# Patient Record
Sex: Female | Born: 1980 | Race: White | Hispanic: No | Marital: Married | Smoking: Never smoker
Health system: Southern US, Community
[De-identification: ages and names within clinical notes are randomized; demographics above are authoritative.]

## PROBLEM LIST (undated history)

## (undated) DIAGNOSIS — Z8489 Family history of other specified conditions: Secondary | ICD-10-CM

## (undated) DIAGNOSIS — M1990 Unspecified osteoarthritis, unspecified site: Secondary | ICD-10-CM

## (undated) DIAGNOSIS — K219 Gastro-esophageal reflux disease without esophagitis: Secondary | ICD-10-CM

## (undated) HISTORY — PX: CHOLECYSTECTOMY: SHX55

## (undated) HISTORY — PX: CARPAL TUNNEL RELEASE: SHX101

## (undated) HISTORY — PX: CERVICAL CONE BIOPSY: SUR198

---

## 2004-10-24 ENCOUNTER — Observation Stay: Payer: Self-pay

## 2004-11-30 ENCOUNTER — Observation Stay: Payer: Self-pay | Admitting: Unknown Physician Specialty

## 2004-12-01 ENCOUNTER — Observation Stay: Payer: Self-pay | Admitting: Unknown Physician Specialty

## 2004-12-02 ENCOUNTER — Inpatient Hospital Stay: Payer: Self-pay | Admitting: Obstetrics & Gynecology

## 2005-10-01 ENCOUNTER — Emergency Department: Payer: Self-pay | Admitting: Emergency Medicine

## 2007-05-04 ENCOUNTER — Ambulatory Visit: Payer: Self-pay | Admitting: Nurse Practitioner

## 2008-01-05 ENCOUNTER — Ambulatory Visit: Payer: Self-pay | Admitting: Obstetrics & Gynecology

## 2008-01-06 ENCOUNTER — Ambulatory Visit: Payer: Self-pay | Admitting: Obstetrics & Gynecology

## 2008-08-15 ENCOUNTER — Ambulatory Visit: Payer: Self-pay | Admitting: Nurse Practitioner

## 2008-09-06 ENCOUNTER — Ambulatory Visit: Payer: Self-pay | Admitting: General Surgery

## 2008-09-12 ENCOUNTER — Ambulatory Visit: Payer: Self-pay | Admitting: General Surgery

## 2009-06-06 ENCOUNTER — Ambulatory Visit: Payer: Self-pay | Admitting: Orthopedic Surgery

## 2009-06-14 ENCOUNTER — Ambulatory Visit: Payer: Self-pay | Admitting: Orthopedic Surgery

## 2010-02-03 ENCOUNTER — Emergency Department: Payer: Self-pay | Admitting: Emergency Medicine

## 2011-10-07 ENCOUNTER — Ambulatory Visit: Payer: Self-pay | Admitting: Specialist

## 2011-10-16 ENCOUNTER — Ambulatory Visit: Payer: Self-pay | Admitting: Specialist

## 2011-10-16 LAB — PREGNANCY, URINE: Pregnancy Test, Urine: NEGATIVE m[IU]/mL

## 2014-07-30 NOTE — Op Note (Signed)
PATIENT NAME:  Allison Mooney, Allison Mooney MR#:  [id number] DATE OF BIRTH:  Nov 17, 1980  DATE OF PROCEDURE:  10/16/2011  PREOPERATIVE DIAGNOSIS: Left carpal tunnel syndrome.   POSTOPERATIVE DIAGNOSIS: Left carpal tunnel syndrome.   PROCEDURE: Left carpal tunnel release.   SURGEON: Myra Rudehristopher Kyrie Fludd, M.D.   ANESTHESIA: General.   COMPLICATIONS: None.   TOURNIQUET TIME: 15 minutes.   DESCRIPTION OF PROCEDURE: After adequate induction of general anesthesia, the left upper extremity is thoroughly prepped with alcohol and ChloraPrep and draped in standard sterile fashion. The extremity is wrapped out with the Esmarch bandage and pneumatic tourniquet elevated to 250 mmHg. Under loupe magnification, a standard volar carpal tunnel incision is made and the dissection carried down to the transverse retinacular ligament. Incision is made in the midportion of the ligament with the knife. The proximal release is performed with the small scissors and the carpal tunnel scissors and the distal release is performed with the small scissors. Careful check is made both proximally and distally to ensure that complete release had been obtained. The wound is thoroughly irrigated multiple times. Skin edges are infiltrated with 1% plain Marcaine. The skin is closed with 4-0 nylon. A soft bulky dressing is applied. The patient is returned to the recovery room in satisfactory condition having tolerated the procedure quite well.  ____________________________ Clare Gandyhristopher E. Oaklee Esther, MD ces:slb D: 10/16/2011 10:37:50 ET     T: 10/16/2011 11:01:53 ET        JOB#: [id number] cc: Clare Gandyhristopher E. Sherie Dobrowolski, MD, <Dictator> Clare GandyHRISTOPHER E Sevin Langenbach MD ELECTRONICALLY SIGNED 10/17/2011 9:04

## 2017-08-04 ENCOUNTER — Encounter
Admission: RE | Admit: 2017-08-04 | Discharge: 2017-08-04 | Disposition: A | Discharge status: Home / Self Care | Payer: Managed Care, Other (non HMO) | Source: Ambulatory Visit | Attending: Specialist | Admitting: Specialist

## 2017-08-04 ENCOUNTER — Encounter: Payer: Self-pay | Admitting: *Deleted

## 2017-08-04 ENCOUNTER — Other Ambulatory Visit: Payer: Self-pay

## 2017-08-04 HISTORY — DX: Gastro-esophageal reflux disease without esophagitis: K21.9

## 2017-08-04 HISTORY — DX: Family history of other specified conditions: Z84.89

## 2017-08-04 HISTORY — DX: Unspecified osteoarthritis, unspecified site: M19.90

## 2017-08-04 NOTE — Patient Instructions (Signed)
Your procedure is scheduled on: 08-10-17 Report to Same Day Surgery 2nd floor medical mall [facility] Entrance-take elevator on left to 2nd floor.  Check in with surgery information desk.) To find out your arrival time please call [phone] between 1PM - 3PM on 08-07-17  Remember: Instructions that are not followed completely may result in serious medical risk, up to and including death, or upon the discretion of your surgeon and anesthesiologist your surgery may need to be rescheduled.    _x___ 1. Do not eat food after midnight the night before your procedure. You may drink clear liquids up to 2 hours before you are scheduled to arrive at the hospital for your procedure.  Do not drink clear liquids within 2 hours of your scheduled arrival to the hospital.  Clear liquids include  --Water or Apple juice without pulp  --Clear carbohydrate beverage such as ClearFast or Gatorade  --Black Coffee or Clear Tea (No milk, no creamers, do not add anything to the coffee or Tea   No gum chewing or hard candies.     __x__ 2. No Alcohol for 24 hours before or after surgery.   __x__3. No Smoking or e-cigarettes for 24 prior to surgery.  Do not use any chewable tobacco products for at least 6 hour prior to surgery   ____  4. Bring all medications with you on the day of surgery if instructed.    __x__ 5. Notify your doctor if there is any change in your medical condition     (cold, fever, infections).    x___6. On the morning of surgery brush your teeth with toothpaste and water.  You may rinse your mouth with mouth wash if you wish.  Do not swallow any toothpaste or mouthwash.   Do not wear jewelry, make-up, hairpins, clips or nail polish.  Do not wear lotions, powders, or perfumes. You may wear deodorant.  Do not shave 48 hours prior to surgery. Men may shave face and neck.  Do not bring valuables to the hospital.    [facility] is not responsible for any belongings or valuables.    Contacts, dentures or bridgework may not be worn into surgery.  Leave your suitcase in the car. After surgery it may be brought to your room.  For patients admitted to the hospital, discharge time is determined by your treatment team.  _  Patients discharged the day of surgery will not be allowed to drive home.  You will need someone to drive you home and stay with you the night of your procedure.   _x___ TAKE THE FOLLOWING MEDICATION THE MORNING OF SURGERY. These include:  1. NEXIUM  2. GABAPENTIN  3.  4.  5.  6.  ____Fleets enema or Magnesium Citrate as directed.   ____ Use CHG Soap or sage wipes as directed on instruction sheet   ____ Use inhalers on the day of surgery and bring to hospital day of surgery  ____ Stop Metformin and Janumet 2 days prior to surgery.    ____ Take 1/2 of usual insulin dose the night before surgery and none on the morning surgery.   ____ Follow recommendations from Cardiologist, Pulmonologist or PCP regarding stopping Aspirin, Coumadin, Plavix ,Eliquis, Effient, or Pradaxa, and Pletal.  X____Stop Anti-inflammatories such as Advil, Aleve, Ibuprofen, Motrin, Naproxen, MELOXICAM. Naprosyn, Goodies powders or aspirin products. OK to take Tylenol OR TYLENOL #3   ____ Stop supplements until after surgery.    ____ Bring C-Pap to the hospital.

## 2017-08-05 ENCOUNTER — Other Ambulatory Visit: Payer: Self-pay | Admitting: Specialist

## 2017-08-09 MED ORDER — CLINDAMYCIN PHOSPHATE 600 MG/50ML IV SOLN
600.0000 mg | INTRAVENOUS | Status: AC
  Administered 2017-08-10: 600 mg via INTRAVENOUS

## 2017-08-10 ENCOUNTER — Encounter
Admission: RE | Disposition: A | Discharge status: Home / Self Care | Payer: Self-pay | Source: Ambulatory Visit | Attending: Specialist

## 2017-08-10 ENCOUNTER — Ambulatory Visit
Admission: RE | Admit: 2017-08-10 | Discharge: 2017-08-10 | Disposition: A | Discharge status: Home / Self Care | Payer: Managed Care, Other (non HMO) | Source: Ambulatory Visit | Attending: Specialist | Admitting: Specialist

## 2017-08-10 ENCOUNTER — Ambulatory Visit: Payer: Managed Care, Other (non HMO) | Admitting: Anesthesiology

## 2017-08-10 DIAGNOSIS — Z79899 Other long term (current) drug therapy: Secondary | ICD-10-CM | POA: Insufficient documentation

## 2017-08-10 DIAGNOSIS — Y929 Unspecified place or not applicable: Secondary | ICD-10-CM | POA: Diagnosis not present

## 2017-08-10 DIAGNOSIS — K219 Gastro-esophageal reflux disease without esophagitis: Secondary | ICD-10-CM | POA: Diagnosis not present

## 2017-08-10 DIAGNOSIS — S83242A Other tear of medial meniscus, current injury, left knee, initial encounter: Secondary | ICD-10-CM | POA: Insufficient documentation

## 2017-08-10 DIAGNOSIS — X58XXXA Exposure to other specified factors, initial encounter: Secondary | ICD-10-CM | POA: Insufficient documentation

## 2017-08-10 HISTORY — PX: KNEE ARTHROSCOPY WITH MENISCAL REPAIR: SHX5653

## 2017-08-10 LAB — POCT PREGNANCY, URINE: Preg Test, Ur: NEGATIVE

## 2017-08-10 SURGERY — ARTHROSCOPY, KNEE, WITH MENISCUS REPAIR
Anesthesia: General | Site: Knee | Laterality: Left | Wound class: "Clean "

## 2017-08-10 MED ORDER — MORPHINE SULFATE (PF) 4 MG/ML IV SOLN
INTRAVENOUS | Status: DC | PRN
  Administered 2017-08-10: 4 mg via INTRAMUSCULAR

## 2017-08-10 MED ORDER — FENTANYL CITRATE (PF) 100 MCG/2ML IJ SOLN
INTRAMUSCULAR | Status: DC | PRN
  Administered 2017-08-10 (×2): 25 ug via INTRAVENOUS
  Administered 2017-08-10 (×2): 50 ug via INTRAVENOUS

## 2017-08-10 MED ORDER — HYDROCODONE-ACETAMINOPHEN 5-325 MG PO TABS
1.0000 | ORAL_TABLET | Freq: Four times a day (QID) | ORAL | Status: DC | PRN
  Administered 2017-08-10: 1 via ORAL

## 2017-08-10 MED ORDER — MIDAZOLAM HCL 2 MG/2ML IJ SOLN
INTRAMUSCULAR | Status: DC | PRN
  Administered 2017-08-10: 2 mg via INTRAVENOUS

## 2017-08-10 MED ORDER — FLUCONAZOLE 100 MG PO TABS: 100.0000 mg | ORAL_TABLET | Freq: Two times a day (BID) | ORAL | 0 refills | Status: AC

## 2017-08-10 MED ORDER — BUPIVACAINE-EPINEPHRINE (PF) 0.5% -1:200000 IJ SOLN
INTRAMUSCULAR | Status: DC | PRN
  Administered 2017-08-10: 35 mL

## 2017-08-10 MED ORDER — CHLORHEXIDINE GLUCONATE CLOTH 2 % EX PADS: 6.0000 | MEDICATED_PAD | Freq: Once | CUTANEOUS | Status: DC

## 2017-08-10 MED ORDER — MELOXICAM 7.5 MG PO TABS
15.0000 mg | ORAL_TABLET | ORAL | Status: AC
  Administered 2017-08-10: 15 mg via ORAL

## 2017-08-10 MED ORDER — PROPOFOL 10 MG/ML IV BOLUS
INTRAVENOUS | Status: AC
  Filled 2017-08-10: qty 20

## 2017-08-10 MED ORDER — ONDANSETRON HCL 4 MG/2ML IJ SOLN
INTRAMUSCULAR | Status: DC | PRN
Start: 2017-08-10 — End: 2017-08-10
  Administered 2017-08-10: 4 mg via INTRAVENOUS

## 2017-08-10 MED ORDER — GABAPENTIN 300 MG PO CAPS
300.0000 mg | ORAL_CAPSULE | ORAL | Status: AC
  Administered 2017-08-10: 300 mg via ORAL

## 2017-08-10 MED ORDER — HYDROCODONE-ACETAMINOPHEN 5-325 MG PO TABS
ORAL_TABLET | ORAL | Status: AC
  Filled 2017-08-10: qty 1

## 2017-08-10 MED ORDER — ACETAMINOPHEN 10 MG/ML IV SOLN
INTRAVENOUS | Status: AC
  Filled 2017-08-10: qty 100

## 2017-08-10 MED ORDER — HYDROCODONE-ACETAMINOPHEN 7.5-325 MG PO TABS
1.0000 | ORAL_TABLET | Freq: Once | ORAL | Status: DC | PRN
  Filled 2017-08-10: qty 1

## 2017-08-10 MED ORDER — ACETAMINOPHEN 500 MG PO TABS: 1000.0000 mg | ORAL_TABLET | Freq: Once | ORAL | Status: DC

## 2017-08-10 MED ORDER — PROMETHAZINE HCL 25 MG/ML IJ SOLN: 6.2500 mg | INTRAMUSCULAR | Status: DC | PRN

## 2017-08-10 MED ORDER — MIDAZOLAM HCL 2 MG/2ML IJ SOLN
INTRAMUSCULAR | Status: AC
  Filled 2017-08-10: qty 2

## 2017-08-10 MED ORDER — CEFOXITIN SODIUM-DEXTROSE 2-2.2 GM-%(50ML) IV SOLR
INTRAVENOUS | Status: AC
  Filled 2017-08-10: qty 50

## 2017-08-10 MED ORDER — BUPIVACAINE-EPINEPHRINE (PF) 0.25% -1:200000 IJ SOLN
INTRAMUSCULAR | Status: AC
Start: 2017-08-10 — End: 2017-08-10
  Filled 2017-08-10: qty 30

## 2017-08-10 MED ORDER — LACTATED RINGERS IV SOLN
INTRAVENOUS | Status: DC
  Administered 2017-08-10: 11:00:00 via INTRAVENOUS

## 2017-08-10 MED ORDER — CEFAZOLIN SODIUM-DEXTROSE 2-4 GM/100ML-% IV SOLN
2.0000 g | INTRAVENOUS | Status: AC
  Administered 2017-08-10: 2 g via INTRAVENOUS

## 2017-08-10 MED ORDER — DEXAMETHASONE SODIUM PHOSPHATE 10 MG/ML IJ SOLN
INTRAMUSCULAR | Status: DC | PRN
  Administered 2017-08-10: 10 mg via INTRAVENOUS

## 2017-08-10 MED ORDER — BUPIVACAINE-EPINEPHRINE (PF) 0.25% -1:200000 IJ SOLN
INTRAMUSCULAR | Status: DC | PRN
  Administered 2017-08-10: 30 mL

## 2017-08-10 MED ORDER — FENTANYL CITRATE (PF) 100 MCG/2ML IJ SOLN
INTRAMUSCULAR | Status: AC
  Filled 2017-08-10: qty 2

## 2017-08-10 MED ORDER — GABAPENTIN 300 MG PO CAPS
ORAL_CAPSULE | ORAL | Status: AC
  Filled 2017-08-10: qty 1

## 2017-08-10 MED ORDER — PROPOFOL 10 MG/ML IV BOLUS
INTRAVENOUS | Status: DC | PRN
  Administered 2017-08-10: 160 mg via INTRAVENOUS

## 2017-08-10 MED ORDER — MEPERIDINE HCL 50 MG/ML IJ SOLN: 6.2500 mg | INTRAMUSCULAR | Status: DC | PRN

## 2017-08-10 MED ORDER — BUPIVACAINE-EPINEPHRINE (PF) 0.25% -1:200000 IJ SOLN
INTRAMUSCULAR | Status: AC
  Filled 2017-08-10: qty 30

## 2017-08-10 MED ORDER — MORPHINE SULFATE (PF) 4 MG/ML IV SOLN
INTRAVENOUS | Status: AC
  Filled 2017-08-10: qty 1

## 2017-08-10 MED ORDER — CLINDAMYCIN PHOSPHATE 600 MG/50ML IV SOLN
INTRAVENOUS | Status: AC
  Filled 2017-08-10: qty 50

## 2017-08-10 MED ORDER — LIDOCAINE HCL (CARDIAC) PF 100 MG/5ML IV SOSY
PREFILLED_SYRINGE | INTRAVENOUS | Status: DC | PRN
Start: 2017-08-10 — End: 2017-08-10
  Administered 2017-08-10: 100 mg via INTRAVENOUS

## 2017-08-10 MED ORDER — HYDROMORPHONE HCL 1 MG/ML IJ SOLN: 0.2500 mg | INTRAMUSCULAR | Status: DC | PRN

## 2017-08-10 MED ORDER — MELOXICAM 7.5 MG PO TABS
ORAL_TABLET | ORAL | Status: AC
  Filled 2017-08-10: qty 2

## 2017-08-10 MED ORDER — HYDROCODONE-ACETAMINOPHEN 5-325 MG PO TABS: 1.0000 | ORAL_TABLET | Freq: Four times a day (QID) | ORAL | 0 refills | Status: DC | PRN

## 2017-08-10 MED ORDER — SEVOFLURANE IN SOLN
RESPIRATORY_TRACT | Status: AC
  Filled 2017-08-10: qty 250

## 2017-08-10 MED ORDER — ACETAMINOPHEN 10 MG/ML IV SOLN
INTRAVENOUS | Status: DC | PRN
  Administered 2017-08-10: 1000 mg via INTRAVENOUS

## 2017-08-10 MED ORDER — LACTATED RINGERS IR SOLN
Status: DC | PRN
  Administered 2017-08-10: 1

## 2017-08-10 MED ORDER — BUPIVACAINE-EPINEPHRINE (PF) 0.5% -1:200000 IJ SOLN
INTRAMUSCULAR | Status: AC
Start: 2017-08-10 — End: 2017-08-10
  Filled 2017-08-10: qty 60

## 2017-08-10 SURGICAL SUPPLY — 28 items
ADAPTER IRRIG TUBE 2 SPIKE SOL (ADAPTER) ×6 IMPLANT
BLADE AGGRESSIVE PLUS 4.0 (BLADE) IMPLANT
BUR RADIUS 4.0X18.5 (BURR) ×3 IMPLANT
CHLORAPREP W/TINT 26ML (MISCELLANEOUS) ×3 IMPLANT
CUFF TOURN 24 STER (MISCELLANEOUS) IMPLANT
CUFF TOURN 30 STER DUAL PORT (MISCELLANEOUS) IMPLANT
CUTTER SLOTTED WHISKER 4.0 (BURR) IMPLANT
GAUZE SPONGE 4X4 12PLY STRL (GAUZE/BANDAGES/DRESSINGS) ×3 IMPLANT
GAUZE SPONGE 4X4 16PLY XRAY LF (GAUZE/BANDAGES/DRESSINGS) ×2 IMPLANT
GAUZE XEROFORM 4X4 STRL (GAUZE/BANDAGES/DRESSINGS) ×2 IMPLANT
GLOVE BIO SURGEON STRL SZ8 (GLOVE) ×3 IMPLANT
GOWN STRL REUS W/ TWL LRG LVL3 (GOWN DISPOSABLE) ×2 IMPLANT
GOWN STRL REUS W/TWL LRG LVL3 (GOWN DISPOSABLE) ×4
GOWN STRL REUS W/TWL LRG LVL4 (GOWN DISPOSABLE) ×3 IMPLANT
IV LACTATED RINGER IRRG 3000ML (IV SOLUTION) ×12
IV LR IRRIG 3000ML ARTHROMATIC (IV SOLUTION) ×6 IMPLANT
KIT TURNOVER KIT A (KITS) ×3 IMPLANT
MANIFOLD NEPTUNE II (INSTRUMENTS) ×3 IMPLANT
NDL SAFETY ECLIPSE 18X1.5 (NEEDLE) ×2 IMPLANT
NEEDLE HYPO 18GX1.5 SHARP (NEEDLE) ×4
PACK ARTHROSCOPY KNEE (MISCELLANEOUS) ×3 IMPLANT
SET TUBE SUCT SHAVER OUTFL 24K (TUBING) ×3 IMPLANT
SOL PREP PVP 2OZ (MISCELLANEOUS) ×3
SOLUTION PREP PVP 2OZ (MISCELLANEOUS) ×1 IMPLANT
SUT ETHILON 3 0 FSLX (SUTURE) ×3 IMPLANT
SYR 30ML LL (SYRINGE) ×6 IMPLANT
TUBING ARTHRO INFLOW-ONLY STRL (TUBING) ×3 IMPLANT
WAND COBLATION FLOW 50 (SURGICAL WAND) ×3 IMPLANT

## 2017-08-10 NOTE — Anesthesia Preprocedure Evaluation (Addendum)
Anesthesia Evaluation  Patient identified by MRN, date of birth, ID band Patient awake    Reviewed: Allergy & Precautions, H&P , NPO status , reviewed documented beta blocker date and time   Airway Mallampati: III  TM Distance: >3 FB Neck ROM: Full    Dental   Pulmonary neg pulmonary ROS,    Pulmonary exam normal        Cardiovascular negative cardio ROS Normal cardiovascular exam     Neuro/Psych negative neurological ROS  negative psych ROS   GI/Hepatic negative GI ROS, Neg liver ROS, GERD  Medicated and Controlled,  Endo/Other  negative endocrine ROSMorbid obesity  Renal/GU      Musculoskeletal   Abdominal (+) + obese,   Peds  Hematology negative hematology ROS (+)   Anesthesia Other Findings Past Medical History: No date: Arthritis No date: Family history of adverse reaction to anesthesia     Comment:  MOM-DISORIENTED No date: GERD (gastroesophageal reflux disease)  Past Surgical History: No date: CARPAL TUNNEL RELEASE; Bilateral No date: CERVICAL CONE BIOPSY No date: CHOLECYSTECTOMY     Reproductive/Obstetrics                            Anesthesia Physical Anesthesia Plan  ASA: II  Anesthesia Plan: General   Post-op Pain Management:    Induction:   PONV Risk Score and Plan: 3 and Ondansetron, Midazolam and Metaclopromide  Airway Management Planned:   Additional Equipment:   Intra-op Plan:   Post-operative Plan:   Informed Consent: I have reviewed the patients History and Physical, chart, labs and discussed the procedure including the risks, benefits and alternatives for the proposed anesthesia with the patient or authorized representative who has indicated his/her understanding and acceptance.   Dental Advisory Given  Plan Discussed with: CRNA  Anesthesia Plan Comments:         Anesthesia Quick Evaluation

## 2017-08-10 NOTE — Op Note (Signed)
08/10/2017  3:24 PM  PATIENT:  Allison Mooney    PRE-OPERATIVE DIAGNOSIS:  Z61.096E Oth tear of medial meniscus, current injury, left knee, init  POST-OPERATIVE DIAGNOSIS:  Same  PROCEDURE:  KNEE ARTHROSCOPY WITH  MEDIAL AND LATERAL MENISCECTOMIES AND LOOSE BODY REMOVAL  SURGEON:  Jason Frisbee E, MD  COMPLICATIONS:   None  EBL: None  TOURNIQUET TIME:   None  ANESTHESIA: General LMA  PREOPERATIVE INDICATIONS:  Allison Mooney is a  37 y.o. female with a diagnosis of S83.242A Oth tear of medial meniscus, current injury, left knee, init who failed conservative measures and elected for surgical management.    The risks benefits and alternatives were discussed with the patient preoperatively including but not limited to the risks of infection, bleeding, nerve injury, cardiopulmonary complications, the need for revision surgery, among others, and the patient was willing to proceed.  OPERATIVE IMPLANTS: None  OPERATIVE FINDINGS: Large full-thickness loss of articular cartilage lateral tibial plateau centrally, grade 3 damage to medial femoral condyle anteromedially, loose bodies, fraying of patella.  OPERATIVE PROCEDURE: The patient was brought to the operating room and underwent satisfactory general LMA anesthesia in the supine position.  The leg was prepped and draped in a sterile fashion.  Arthroscopy was carried out through standard portals.  The above findings were encountered upon arthroscopy.  The anterior soft tissues were debrided for visualization.  Loose bodies were suctioned out and removed with a motorized shaver.  The medial compartment was examined.  The medial meniscus did not appear to have any significant tearing other than anteromedially which was debrided.  Posteriorly it was intact.  The lateral compartment was examined.  There is a large 2 x 2 centimeter defect full-thickness in the lateral tibial plateau in the central portion.  This was cauterized.  There was fraying of  the lateral meniscus laterally and anteriorly which was debrided with basket forceps and motorized shaver.  The patella showed grade 3 chondromalacia which was cauterized.  The medial and lateral gutters were examined repeatedly with no further loose bodies noted.  The suprapatellar pouch was examined and was suctioned free of debris.  The anterior cruciate ligament and intercondylar notch were normal.  The lateral femoral condyle was intact.  After thorough irrigation and multiple examinations for any more loose bodies the instruments were removed.   Once this was completed and stabilized the joint was thoroughly irrigated.  The knee wounds were closed with 3-0 nylon.  Sponge and needle counts were correct. A dry sterile dressing was applied.  Patient was awakened and taken to recovery in good condition.   Valinda Hoar, MD

## 2017-08-10 NOTE — Anesthesia Postprocedure Evaluation (Signed)
Anesthesia Post Note  Patient: SHYLYNN BRUNING  Procedure(s) Performed: KNEE ARTHROSCOPY WITH MENISCAL REPAIR (Left Knee)  Patient location during evaluation: PACU Anesthesia Type: General Level of consciousness: awake and alert and oriented Pain management: pain level controlled Vital Signs Assessment: post-procedure vital signs reviewed and stable Respiratory status: spontaneous breathing, nonlabored ventilation and respiratory function stable Cardiovascular status: blood pressure returned to baseline and stable Postop Assessment: no signs of nausea or vomiting Anesthetic complications: no     Last Vitals:  Vitals:   08/10/17 1602 08/10/17 1614  BP: 105/68 115/62  Pulse: 84 83  Resp:  14  Temp:  36.7 C  SpO2: 95% 100%    Last Pain:  Vitals:   08/10/17 1642  TempSrc:   PainSc: 5                  Cordarious Zeek

## 2017-08-10 NOTE — H&P (Signed)
THE PATIENT WAS SEEN PRIOR TO SURGERY TODAY.  HISTORY, ALLERGIES, HOME MEDICATIONS AND OPERATIVE PROCEDURE WERE REVIEWED. RISKS AND BENEFITS OF SURGERY DISCUSSED WITH PATIENT AGAIN.  NO CHANGES FROM INITIAL HISTORY AND PHYSICAL NOTED.    

## 2017-08-10 NOTE — Anesthesia Procedure Notes (Signed)
Procedure Name: LMA Insertion Date/Time: 08/10/2017 1:31 PM Performed by: Bari Mantis I, CRNA Pre-anesthesia Checklist: Patient identified, Patient being monitored, Timeout performed, Emergency Drugs available and Suction available Patient Re-evaluated:Patient Re-evaluated prior to induction Oxygen Delivery Method: Circle system utilized Preoxygenation: Pre-oxygenation with 100% oxygen Induction Type: IV induction Ventilation: Mask ventilation without difficulty LMA: LMA inserted LMA Size: 4.0 Tube type: Oral Number of attempts: 1 Placement Confirmation: positive ETCO2 and breath sounds checked- equal and bilateral Tube secured with: Tape Dental Injury: Teeth and Oropharynx as per pre-operative assessment

## 2017-08-10 NOTE — Anesthesia Post-op Follow-up Note (Signed)
Anesthesia QCDR form completed.        

## 2017-08-10 NOTE — Discharge Instructions (Signed)

## 2017-08-10 NOTE — Transfer of Care (Signed)
Immediate Anesthesia Transfer of Care Note  Patient: Allison Mooney  Procedure(s) Performed: KNEE ARTHROSCOPY WITH MENISCAL REPAIR (Left Knee)  Patient Location: PACU  Anesthesia Type:General  Level of Consciousness: sedated  Airway & Oxygen Therapy: Patient Spontanous Breathing and Patient connected to face mask oxygen  Post-op Assessment: Report given to RN and Post -op Vital signs reviewed and stable  Post vital signs: Reviewed and stable  Last Vitals:  Vitals Value Taken Time  BP 117/67 08/10/2017  3:32 PM  Temp 36.5 C 08/10/2017  3:32 PM  Pulse 92 08/10/2017  3:33 PM  Resp 12 08/10/2017  3:32 PM  SpO2 100 % 08/10/2017  3:33 PM  Vitals shown include unvalidated device data.  Last Pain:  Vitals:   08/10/17 1532  TempSrc: Temporal  PainSc:          Complications: No apparent anesthesia complications

## 2017-08-11 ENCOUNTER — Encounter: Payer: Self-pay | Admitting: Specialist

## 2017-11-10 ENCOUNTER — Encounter: Payer: Self-pay | Admitting: Obstetrics and Gynecology

## 2017-11-17 ENCOUNTER — Encounter: Payer: Self-pay | Admitting: Obstetrics and Gynecology

## 2017-11-24 ENCOUNTER — Encounter: Payer: Self-pay | Admitting: Obstetrics and Gynecology

## 2017-11-24 ENCOUNTER — Ambulatory Visit (INDEPENDENT_AMBULATORY_CARE_PROVIDER_SITE_OTHER): Payer: Managed Care, Other (non HMO) | Admitting: Obstetrics and Gynecology

## 2017-11-24 ENCOUNTER — Other Ambulatory Visit (HOSPITAL_COMMUNITY)
Admission: RE | Admit: 2017-11-24 | Discharge: 2017-11-24 | Disposition: A | Discharge status: Home / Self Care | Payer: Managed Care, Other (non HMO) | Source: Ambulatory Visit | Attending: Obstetrics and Gynecology | Admitting: Obstetrics and Gynecology

## 2017-11-24 VITALS — BP 132/90 | HR 81 | Ht 68.0 in | Wt 266.0 lb

## 2017-11-24 DIAGNOSIS — Z124 Encounter for screening for malignant neoplasm of cervix: Secondary | ICD-10-CM | POA: Insufficient documentation

## 2017-11-24 DIAGNOSIS — R87810 Cervical high risk human papillomavirus (HPV) DNA test positive: Secondary | ICD-10-CM | POA: Insufficient documentation

## 2017-11-24 DIAGNOSIS — Z113 Encounter for screening for infections with a predominantly sexual mode of transmission: Secondary | ICD-10-CM | POA: Diagnosis present

## 2017-11-24 DIAGNOSIS — N761 Subacute and chronic vaginitis: Secondary | ICD-10-CM | POA: Diagnosis not present

## 2017-11-24 DIAGNOSIS — E282 Polycystic ovarian syndrome: Secondary | ICD-10-CM | POA: Diagnosis not present

## 2017-11-24 DIAGNOSIS — Z6841 Body Mass Index (BMI) 40.0 and over, adult: Secondary | ICD-10-CM

## 2017-11-24 MED ORDER — METRONIDAZOLE 500 MG PO TABS: 500.0000 mg | ORAL_TABLET | Freq: Two times a day (BID) | ORAL | 0 refills | Status: DC

## 2017-11-24 MED ORDER — TERCONAZOLE 0.4 % VA CREA: 1.0000 | TOPICAL_CREAM | Freq: Every day | VAGINAL | 1 refills | Status: DC

## 2017-11-24 NOTE — Progress Notes (Signed)
Obstetrics & Gynecology Office Visit   Chief Complaint:  Chief Complaint  Patient presents with  . Vaginal irritation    discharge,  vaginal itching    History of Present Illness: [name] is a 37 y.o. No obstetric history on file. who LMP was No LMP recorded. (Menstrual status: Other)., presents today for a problem visit.   Patient complains of an abnormal vaginal discharge.  Discharge described as: white and thick. Vaginal symptoms include local irritation.   Other associated symptoms: local irritation.Menstrual pattern: She had achieved amenorrhea of depo provera.  Contraception: Depo-Provera injections.  She denies recent antibiotic exposure, denies changes in soaps, detergents coinciding with the onset of her symptoms.  She has previously self treated or been under treatment by another provider for these symptoms.  She relays a 2 year history of recurrent vaginitis with several episodes of BV and candida each year.  Frequency appears to have increased per patient after Mirena removal.    She does have a prior history of PCOS.  Weight is up 30lbs in last 5 years.  Review of Systems: Review of Systems  Constitutional: Negative.   Gastrointestinal: Negative.   Genitourinary: Positive for dysuria. Negative for flank pain, frequency, hematuria and urgency.  Skin: Negative.    Past Medical History:  Past Medical History:  Diagnosis Date  . Arthritis   . Family history of adverse reaction to anesthesia    MOM-DISORIENTED  . GERD (gastroesophageal reflux disease)     Past Surgical History:  Past Surgical History:  Procedure Laterality Date  . CARPAL TUNNEL RELEASE Bilateral   . CERVICAL CONE BIOPSY    . CHOLECYSTECTOMY    . KNEE ARTHROSCOPY WITH MENISCAL REPAIR Left 08/10/2017   Procedure: KNEE ARTHROSCOPY WITH MENISCAL REPAIR;  Surgeon: Deeann Saint, MD;  Location: [facility] ORS;  Service: Orthopedics;  Laterality: Left;    Gynecologic History: No LMP recorded.  (Menstrual status: Other).  Obstetric History: No obstetric history on file.  Family History:  History reviewed. No pertinent family history.  Social History:  Social History   Socioeconomic History  . Marital status: Married    Spouse name: Not on file  . Number of children: Not on file  . Years of education: Not on file  . Highest education level: Not on file  Occupational History  . Not on file  Social Needs  . Financial resource strain: Not on file  . Food insecurity:    Worry: Not on file    Inability: Not on file  . Transportation needs:    Medical: Not on file    Non-medical: Not on file  Tobacco Use  . Smoking status: Never Smoker  . Smokeless tobacco: Never Used  Substance and Sexual Activity  . Alcohol use: Never    Frequency: Never  . Drug use: Never  . Sexual activity: Yes    Birth control/protection: Injection  Lifestyle  . Physical activity:    Days per week: Not on file    Minutes per session: Not on file  . Stress: Not on file  Relationships  . Social connections:    Talks on phone: Not on file    Gets together: Not on file    Attends religious service: Not on file    Active member of club or organization: Not on file    Attends meetings of clubs or organizations: Not on file    Relationship status: Not on file  . Intimate partner violence:  Fear of current or ex partner: Not on file    Emotionally abused: Not on file    Physically abused: Not on file    Forced sexual activity: Not on file  Other Topics Concern  . Not on file  Social History Narrative  . Not on file    Allergies:  Allergies  Allergen Reactions  . Diflucan [Fluconazole] Itching and Rash  . Penicillins Itching and Rash    Has patient had a PCN reaction causing immediate rash, facial/tongue/throat swelling, SOB or lightheadedness with hypotension: Yes Has patient had a PCN reaction causing severe rash involving mucus membranes or skin necrosis: No Has patient had a PCN  reaction that required hospitalization: Yes Has patient had a PCN reaction occurring within the last 10 years: Yes If all of the above answers are "NO", then may proceed with Cephalosporin use.   . Sulfa Antibiotics Itching and Rash    Medications: Prior to Admission medications   Medication Sig Start Date End Date Taking? Authorizing Provider  acetaminophen-codeine (TYLENOL #3) 300-30 MG tablet Take 1 tablet by mouth daily as needed for moderate pain.    [provider]  esomeprazole (NEXIUM) 20 MG capsule Take 20 mg by mouth daily at 12 noon.    [provider]  gabapentin (NEURONTIN) 300 MG capsule Take 300-600 mg by mouth See admin instructions. Take 300 mg at lunch and 600 mg at bedtime    [provider]  HYDROcodone-acetaminophen (NORCO) 5-325 MG tablet Take 1 tablet by mouth every 6 (six) hours as needed. 08/10/17   Deeann SaintMiller, Howard, MD  medroxyPROGESTERone (DEPO-PROVERA) 150 MG/ML injection Inject 150 mg into the muscle every 3 (three) months.    [provider]  meloxicam (MOBIC) 15 MG tablet Take 15 mg by mouth daily at 12 noon.    [provider]  Probiotic CAPS Take 1 capsule by mouth daily at 12 noon.    [provider]  terconazole (TERAZOL 3) 80 MG vaginal suppository Place 80 mg vaginally daily as needed (bacterial vaginosis).    [provider]    Physical Exam Vitals: Blood pressure 132/90, pulse 81, height 5\' 8"  (1.727 m), weight 266 lb (120.7 kg).  Body mass index is 40.45 kg/m.  No LMP recorded. (Menstrual status: Other).  General: NAD HEENT: normocephalic, anicteric Pulmonary: No increased work of breathing Genitourinary:  External: Normal external female genitalia.  Normal urethral meatus, normal  Bartholin's and Skene's glands.    Vagina: Normal vaginal mucosa, no evidence of prolapse.    Cervix: Grossly normal in appearance, no bleeding  Uterus: Non-enlarged, mobile, normal contour.  No  CMT  Adnexa: ovaries non-enlarged, no adnexal masses  Rectal: deferred  Lymphatic: no evidence of inguinal lymphadenopathy Extremities: no edema, erythema, or tenderness Neurologic: Grossly intact Psychiatric: mood appropriate, affect full  Female chaperone present for pelvic  portions of the physical exam  Wet Prep: Clue Cells: Positive Fungal elements: Positive Trichomonas: Negative   Assessment: 37 y.o. with chronic BV and vaginal candida  Plan: Problem List Items Addressed This Visit    None    Visit Diagnoses    Screening for malignant neoplasm of cervix    -  Primary   Relevant Orders   Cytology - PAP   Chronic vaginitis       Relevant Orders   Hemoglobin A1c (Completed)   NuSwab BV and Candida, NAA   Class 3 severe obesity without serious comorbidity with body mass index (BMI) of 40.0 to 44.9  in adult, unspecified obesity type (HCC)       Relevant Orders   Hemoglobin A1c (Completed)   PCOS (polycystic ovarian syndrome)       Relevant Orders   Hemoglobin A1c (Completed)   Routine screening for STI (sexually transmitted infection)       Relevant Orders   Cytology - PAP      1) Risk factors for bacterial vaginosis and candida infections discussed.  We discussed normal vaginal flora/microbiome.  Any factors that may alter the microbiome increase the risk of these opportunistic infections.  These include changes in pH, antibiotic exposures, diabetes, wet bathing suits etc.  We discussed that treatment is aimed at eradicating abnormal bacterial overgrowth and or yeast.  There may be some role for vaginal probiotics in restoring normal vaginal flora.   - pap out of date collected today - NuSwab BV candida - Rx flagyl and terazole cream - HgbA1C given obesity, PCOS and recurrent/chronic nature   Vena AustriaAndreas Cortny Bambach, MD, Merlinda FrederickFACOG [facility], [facility] 11/24/2017, 11:00 AM

## 2017-11-25 LAB — HEMOGLOBIN A1C
Est. average glucose Bld gHb Est-mCnc: 103 mg/dL
Hgb A1c MFr Bld: 5.2 % (ref 4.8–5.6)

## 2017-11-26 LAB — NUSWAB BV AND CANDIDA, NAA
Candida albicans, NAA: NEGATIVE
Candida glabrata, NAA: NEGATIVE

## 2017-11-27 LAB — CYTOLOGY - PAP
Chlamydia: NEGATIVE
Diagnosis: NEGATIVE
HPV (WINDOPATH): DETECTED — AB
HPV 16/18/45 genotyping: POSITIVE — AB
Neisseria Gonorrhea: NEGATIVE
TRICH (WINDOWPATH): NEGATIVE

## 2017-12-02 ENCOUNTER — Telehealth: Payer: Self-pay | Admitting: Obstetrics and Gynecology

## 2017-12-02 NOTE — Telephone Encounter (Signed)
Patient is calling for labs results. Please advise. 

## 2017-12-08 ENCOUNTER — Ambulatory Visit (INDEPENDENT_AMBULATORY_CARE_PROVIDER_SITE_OTHER): Payer: Managed Care, Other (non HMO)

## 2017-12-08 DIAGNOSIS — N912 Amenorrhea, unspecified: Secondary | ICD-10-CM

## 2017-12-08 DIAGNOSIS — Z3202 Encounter for pregnancy test, result negative: Secondary | ICD-10-CM

## 2017-12-08 DIAGNOSIS — Z3042 Encounter for surveillance of injectable contraceptive: Secondary | ICD-10-CM | POA: Diagnosis not present

## 2017-12-08 LAB — POCT URINE PREGNANCY: PREG TEST UR: NEGATIVE

## 2017-12-08 MED ORDER — MEDROXYPROGESTERONE ACETATE 150 MG/ML IM SUSP
150.0000 mg | Freq: Once | INTRAMUSCULAR | Status: AC
  Administered 2017-12-08: 150 mg via INTRAMUSCULAR

## 2017-12-08 NOTE — Progress Notes (Signed)
Pt presents for Depo Provera. She has previously received in [place], [place] and states her last dose was received on 09/15/17. No record of this on file. Pregnancy test performed per protocol. (negative)

## 2017-12-23 ENCOUNTER — Ambulatory Visit: Payer: Managed Care, Other (non HMO) | Admitting: Obstetrics and Gynecology

## 2017-12-31 ENCOUNTER — Ambulatory Visit (INDEPENDENT_AMBULATORY_CARE_PROVIDER_SITE_OTHER): Payer: Managed Care, Other (non HMO) | Admitting: Obstetrics and Gynecology

## 2017-12-31 ENCOUNTER — Encounter: Payer: Self-pay | Admitting: Obstetrics and Gynecology

## 2017-12-31 ENCOUNTER — Other Ambulatory Visit (HOSPITAL_COMMUNITY)
Admission: RE | Admit: 2017-12-31 | Discharge: 2017-12-31 | Disposition: A | Discharge status: Home / Self Care | Payer: Managed Care, Other (non HMO) | Source: Ambulatory Visit | Attending: Obstetrics and Gynecology | Admitting: Obstetrics and Gynecology

## 2017-12-31 VITALS — BP 130/76 | HR 98 | Ht 68.0 in | Wt 266.0 lb

## 2017-12-31 DIAGNOSIS — N870 Mild cervical dysplasia: Secondary | ICD-10-CM | POA: Insufficient documentation

## 2017-12-31 DIAGNOSIS — R87810 Cervical high risk human papillomavirus (HPV) DNA test positive: Secondary | ICD-10-CM

## 2017-12-31 NOTE — Progress Notes (Signed)
Obstetrics & Gynecology Office Visit   Chief Complaint:  Chief Complaint  Patient presents with  . Colposcopy    History of Present Illness:Allison Mooney is a 37 y.o. woman who presents today for continued surveillance for history of dysplasia. Last pap obtained on 11/24/17 revealed NIL HR HPV positive subtype 16 positive.  Prior paps were normal but remote history of CKC at age 48. No abnormal bleeding  Pap/Treatment History:  01/09/2010 NIL 11/23/2008 NIL 11/22/2007 NIL 07/14/2006 NIL 06/25/2005 NIL 06/17/2004 NIL Reports prior conization of cervix at age 37  Review of Systems: Review of systems negative unless noted on HPI  Past Medical History:  Past Medical History:  Diagnosis Date  . Arthritis   . Family history of adverse reaction to anesthesia    MOM-DISORIENTED  . GERD (gastroesophageal reflux disease)     Past Surgical History:  Past Surgical History:  Procedure Laterality Date  . CARPAL TUNNEL RELEASE Bilateral   . CERVICAL CONE BIOPSY    . CHOLECYSTECTOMY    . KNEE ARTHROSCOPY WITH MENISCAL REPAIR Left 08/10/2017   Procedure: KNEE ARTHROSCOPY WITH MENISCAL REPAIR;  Surgeon: Deeann Saint, MD;  Location: [facility] ORS;  Service: Orthopedics;  Laterality: Left;    Gynecologic History: No LMP recorded. (Menstrual status: Other).  Obstetric History: G1P1001  Family History:  No family history on file.  Social History:  Social History   Socioeconomic History  . Marital status: Married    Spouse name: Not on file  . Number of children: Not on file  . Years of education: Not on file  . Highest education level: Not on file  Occupational History  . Not on file  Social Needs  . Financial resource strain: Not on file  . Food insecurity:    Worry: Not on file    Inability: Not on file  . Transportation needs:    Medical: Not on file    Non-medical: Not on file  Tobacco Use  . Smoking status: Never Smoker  . Smokeless tobacco: Never Used  Substance  and Sexual Activity  . Alcohol use: Never    Frequency: Never  . Drug use: Never  . Sexual activity: Yes    Birth control/protection: Injection  Lifestyle  . Physical activity:    Days per week: Not on file    Minutes per session: Not on file  . Stress: Not on file  Relationships  . Social connections:    Talks on phone: Not on file    Gets together: Not on file    Attends religious service: Not on file    Active member of club or organization: Not on file    Attends meetings of clubs or organizations: Not on file    Relationship status: Not on file  . Intimate partner violence:    Fear of current or ex partner: Not on file    Emotionally abused: Not on file    Physically abused: Not on file    Forced sexual activity: Not on file  Other Topics Concern  . Not on file  Social History Narrative  . Not on file    Allergies:  Allergies  Allergen Reactions  . Diflucan [Fluconazole] Itching and Rash  . Penicillins Itching and Rash    Has patient had a PCN reaction causing immediate rash, facial/tongue/throat swelling, SOB or lightheadedness with hypotension: Yes Has patient had a PCN reaction causing severe rash involving mucus membranes or skin necrosis: No Has patient had  a PCN reaction that required hospitalization: Yes Has patient had a PCN reaction occurring within the last 10 years: Yes If all of the above answers are "NO", then may proceed with Cephalosporin use.   . Sulfa Antibiotics Itching and Rash    Medications: Prior to Admission medications   Medication Sig Start Date End Date Taking? Authorizing Provider  acetaminophen-codeine (TYLENOL #3) 300-30 MG tablet Take 1 tablet by mouth daily as needed for moderate pain.   Yes [provider]  esomeprazole (NEXIUM) 20 MG capsule Take 20 mg by mouth daily at 12 noon.   Yes [provider]  gabapentin (NEURONTIN) 300 MG capsule Take 300-600 mg by mouth See admin instructions. Take 300 mg at lunch and  600 mg at bedtime   Yes [provider]  medroxyPROGESTERone (DEPO-PROVERA) 150 MG/ML injection Inject 150 mg into the muscle every 3 (three) months.   Yes [provider]  meloxicam (MOBIC) 15 MG tablet Take 15 mg by mouth daily at 12 noon.   Yes [provider]  Probiotic CAPS Take 1 capsule by mouth daily at 12 noon.   Yes [provider]  traMADol Janean Sark) 50 MG tablet  12/25/17  Yes [provider]    Physical Exam Vitals:  Vitals:   12/31/17 1427  BP: 130/76  Pulse: 98   No LMP recorded. (Menstrual status: Other).  General: NAD HEENT: normocephalic, anicteric Pulmonary: No increased work of breathing Genitourinary:  External: Normal external female genitalia.  Normal urethral meatus, normal  Bartholin's and Skene's glands.    Vagina: Normal vaginal mucosa, no evidence of prolapse.    Cervix: Grossly normal in appearance, no bleeding  Uterus: Non-enlarged, mobile, normal contour.  No CMT  Adnexa: ovaries non-enlarged, no adnexal masses  Rectal: deferred  Lymphatic: no evidence of inguinal lymphadenopathy Extremities: no edema, erythema, or tenderness Neurologic: Grossly intact Psychiatric: mood appropriate, affect full  Female chaperone present for pelvic exam    GYNECOLOGY CLINIC COLPOSCOPY PROCEDURE NOTE  37 y.o. G1P1001 here for colposcopy for NIL and HR HPV+  pap smear on 11/24/2017. Discussed underlying role for HPV infection in the development of cervical dysplasia, its natural history and progression/regression, need for surveillance.  Is the patient  pregnant: No LMP: No LMP recorded. (Menstrual status: Other). Smoking status:  reports that she has never smoked. She has never used smokeless tobacco. Contraception: Depo-Provera injections  Patient given informed consent, signed copy in the chart, time out was performed.  The patient was position in dorsal lithotomy position. Speculum was placed the cervix was  visualized.   After application of acetic acid colposcopic inspection of the cervix was undertaken.   Colposcopy adequate, full visualization of transformation zone: Yes Aceto white lesion 6 O'Clock v; corresponding biopsies obtained.   ECC specimen obtained:  Yes  All specimens were labeled and sent to pathology.   Patient was given post procedure instructions.  Will follow up pathology and manage accordingly.  Routine preventative health maintenance measures emphasized.     Assessment: 37 y.o. G1P1001 follow up for  NIL HPV positive pap  Plan: Problem List Items Addressed This Visit    None    Visit Diagnoses    Pap smear of cervix shows high risk HPV present    -  Primary   Relevant Orders   Surgical pathology      - Colposcopy today  - I had a lengthly discussion with Arneta Cliche  regarding the cause of dysplasia of the  lower genital tract (including immunosuppression in the setting of HPV exposure and tobacco exposure). I explained the potential for progression to invasive malignancy, the recurrent nature of these lesions (and the need for close continued followup). Results of today's colpsocopy will dictate need for further evaluation and follow up per ASCCP guidelines..  - We discussed that 80% of the population will have exposure to human papilloma virus (HPV) during their lifetime.  HPV is a large group of viruses, and are also the causative virus for common warts and genital warts.  The pap smear tests for 13 high risk HPV strains that have some association with cervical cancer, but do not cause visual lesion such as warts.  HPV type 16 and 18 have the highest association with cervical cancer.  The vast majority of HPV infections will be uncomplicated at clear spontaneously in 12-18 months in non-immunocompromised patients.  Patient with compromised immune systems, those taking immunosuppressive drugs, or smoker have shown to have a lower clearance rate and higher  persistence of HPV infection.    Currently there are no FDA approved treatments to promote HPV clearance.  Gardasil vaccination is available to prevent HPV infection, but this is only beneficial pre-exposure.  Abstaining from intercourse will not increase clearance  Lastly we stressed that if properly followed HPV should not lead to cervical cancer.   The goal of screening is to identify patient who develop precancerous lesions of the cervix and treat these prior to progression to frank cervical cancer.  The incidence of cervical cancer is 7 cases per 100,000 women in the [place] a year.  This relatively low rate is in part due to universal screening as well as vaccination efforts.     - She is comfortable with the plan and had her questions answered.  - Return in about 1 year (around 01/01/2019) for annual.   Vena Austria, MD, [facility], [facility] 12/31/2017, 3:04 PM

## 2018-03-02 ENCOUNTER — Ambulatory Visit (INDEPENDENT_AMBULATORY_CARE_PROVIDER_SITE_OTHER): Payer: Managed Care, Other (non HMO)

## 2018-03-02 DIAGNOSIS — Z3042 Encounter for surveillance of injectable contraceptive: Secondary | ICD-10-CM | POA: Diagnosis not present

## 2018-03-02 MED ORDER — MEDROXYPROGESTERONE ACETATE 150 MG/ML IM SUSP
150.0000 mg | Freq: Once | INTRAMUSCULAR | Status: AC
  Administered 2018-03-02: 150 mg via INTRAMUSCULAR

## 2018-03-09 ENCOUNTER — Ambulatory Visit: Payer: Managed Care, Other (non HMO) | Admitting: Obstetrics and Gynecology

## 2018-03-17 ENCOUNTER — Other Ambulatory Visit: Payer: Self-pay | Admitting: Obstetrics and Gynecology

## 2018-03-17 NOTE — Telephone Encounter (Signed)
advise

## 2018-05-20 ENCOUNTER — Other Ambulatory Visit: Payer: Self-pay | Admitting: Specialist

## 2018-05-25 ENCOUNTER — Other Ambulatory Visit: Payer: Self-pay

## 2018-05-25 ENCOUNTER — Ambulatory Visit (INDEPENDENT_AMBULATORY_CARE_PROVIDER_SITE_OTHER): Payer: 59

## 2018-05-25 DIAGNOSIS — Z3042 Encounter for surveillance of injectable contraceptive: Secondary | ICD-10-CM

## 2018-05-25 MED ORDER — MEDROXYPROGESTERONE ACETATE 150 MG/ML IM SUSP
150.0000 mg | Freq: Once | INTRAMUSCULAR | Status: AC
  Administered 2018-05-25: 150 mg via INTRAMUSCULAR

## 2018-05-25 MED ORDER — MEDROXYPROGESTERONE ACETATE 150 MG/ML IM SUSP: 150.0000 mg | INTRAMUSCULAR | 3 refills | Status: DC

## 2018-05-25 NOTE — Progress Notes (Signed)
Pt needs more refills on Depo

## 2018-05-31 ENCOUNTER — Other Ambulatory Visit: Payer: Self-pay

## 2018-05-31 ENCOUNTER — Encounter: Payer: Self-pay | Admitting: *Deleted

## 2018-05-31 ENCOUNTER — Encounter
Admission: RE | Admit: 2018-05-31 | Discharge: 2018-05-31 | Disposition: A | Discharge status: Home / Self Care | Payer: 59 | Source: Ambulatory Visit | Attending: Specialist | Admitting: Specialist

## 2018-05-31 NOTE — Patient Instructions (Signed)
Your procedure is scheduled on: 3-2-2- Report to Same Day Surgery 2nd floor medical mall [facility] Entrance-take elevator on left to 2nd floor.  Check in with surgery information desk.) To find out your arrival time please call [phone] between 1PM - 3PM on 06-04-18   Remember: Instructions that are not followed completely may result in serious medical risk, up to and including death, or upon the discretion of your surgeon and anesthesiologist your surgery may need to be rescheduled.    _x___ 1. Do not eat food after midnight the night before your procedure. You may drink clear liquids up to 2 hours before you are scheduled to arrive at the hospital for your procedure.  Do not drink clear liquids within 2 hours of your scheduled arrival to the hospital.  Clear liquids include  --Water or Apple juice without pulp  --Clear carbohydrate beverage such as ClearFast or Gatorade  --Black Coffee or Clear Tea (No milk, no creamers, do not add anything to the coffee or Tea   ____Ensure clear carbohydrate drink on the way to the hospital for bariatric patients  ____Ensure clear carbohydrate drink 3 hours before surgery for Dr Rutherford Nail patients if physician instructed.   No gum chewing or hard candies.     __x__ 2. No Alcohol for 24 hours before or after surgery.   __x__3. No Smoking or e-cigarettes for 24 prior to surgery.  Do not use any chewable tobacco products for at least 6 hour prior to surgery   ____  4. Bring all medications with you on the day of surgery if instructed.    __x__ 5. Notify your doctor if there is any change in your medical condition     (cold, fever, infections).    x___6. On the morning of surgery brush your teeth with toothpaste and water.  You may rinse your mouth with mouth wash if you wish.  Do not swallow any toothpaste or mouthwash.   Do not wear jewelry, make-up, hairpins, clips or nail polish.  Do not wear lotions, powders, or perfumes. You may wear  deodorant.  Do not shave 48 hours prior to surgery. Men may shave face and neck.  Do not bring valuables to the hospital.    [facility] is not responsible for any belongings or valuables.               Contacts, dentures or bridgework may not be worn into surgery.  Leave your suitcase in the car. After surgery it may be brought to your room.  For patients admitted to the hospital, discharge time is determined by your treatment team.  _  Patients discharged the day of surgery will not be allowed to drive home.  You will need someone to drive you home and stay with you the night of your procedure.    Please read over the following fact sheets that you were given:   [facility] Preparing for Surgery   _x___ TAKE THE FOLLOWING MEDICATION THE MORNING OF SURGERY WITH A SMALL SIP OF WATER. These include:  1. GABAPENTIN  2. NEXIUM  3. TAKE A NEXIUM THE NIGHT BEFORE SURGERY  4.  5.  6.  ____Fleets enema or Magnesium Citrate as directed.   ____ Use CHG Soap or sage wipes as directed on instruction sheet   ____ Use inhalers on the day of surgery and bring to hospital day of surgery  ____ Stop Metformin and Janumet 2 days prior to surgery.    ____ Take  1/2 of usual insulin dose the night before surgery and none on the morning surgery.   ____ Follow recommendations from Cardiologist, Pulmonologist or PCP regarding stopping Aspirin, Coumadin, Plavix ,Eliquis, Effient, or Pradaxa, and Pletal.  X____Stop Anti-inflammatories such as Advil, Aleve, Ibuprofen, Motrin, Naproxen, MELOXICAM, Naprosyn, Goodies powders or aspirin products NOW-OK to take Tylenol # 3 OR TRAMADOL IF NEEDED   ____ Stop supplements until after surgery   ____ Bring C-Pap to the hospital.

## 2018-06-04 MED ORDER — PROPOFOL 10 MG/ML IV BOLUS
INTRAVENOUS | Status: AC
  Filled 2018-06-04: qty 20

## 2018-06-07 ENCOUNTER — Encounter
Admission: RE | Disposition: A | Discharge status: Home / Self Care | Payer: Self-pay | Source: Home / Self Care | Attending: Specialist

## 2018-06-07 ENCOUNTER — Ambulatory Visit
Admission: RE | Admit: 2018-06-07 | Discharge: 2018-06-07 | Disposition: A | Discharge status: Home / Self Care | Payer: No Typology Code available for payment source | Attending: Specialist | Admitting: Specialist

## 2018-06-07 ENCOUNTER — Encounter: Payer: Self-pay | Admitting: *Deleted

## 2018-06-07 ENCOUNTER — Ambulatory Visit: Payer: No Typology Code available for payment source | Admitting: Anesthesiology

## 2018-06-07 ENCOUNTER — Other Ambulatory Visit: Payer: Self-pay

## 2018-06-07 DIAGNOSIS — X58XXXA Exposure to other specified factors, initial encounter: Secondary | ICD-10-CM | POA: Insufficient documentation

## 2018-06-07 DIAGNOSIS — S83282A Other tear of lateral meniscus, current injury, left knee, initial encounter: Secondary | ICD-10-CM | POA: Insufficient documentation

## 2018-06-07 DIAGNOSIS — M1990 Unspecified osteoarthritis, unspecified site: Secondary | ICD-10-CM | POA: Diagnosis not present

## 2018-06-07 DIAGNOSIS — E669 Obesity, unspecified: Secondary | ICD-10-CM | POA: Diagnosis not present

## 2018-06-07 DIAGNOSIS — Z6841 Body Mass Index (BMI) 40.0 and over, adult: Secondary | ICD-10-CM | POA: Diagnosis not present

## 2018-06-07 DIAGNOSIS — K219 Gastro-esophageal reflux disease without esophagitis: Secondary | ICD-10-CM | POA: Diagnosis not present

## 2018-06-07 DIAGNOSIS — M2242 Chondromalacia patellae, left knee: Secondary | ICD-10-CM | POA: Diagnosis not present

## 2018-06-07 HISTORY — PX: KNEE ARTHROSCOPY: SHX127

## 2018-06-07 LAB — POCT PREGNANCY, URINE: Preg Test, Ur: NEGATIVE

## 2018-06-07 SURGERY — ARTHROSCOPY, KNEE
Anesthesia: General | Laterality: Left

## 2018-06-07 MED ORDER — PROPOFOL 10 MG/ML IV BOLUS
INTRAVENOUS | Status: DC | PRN
  Administered 2018-06-07: 200 mg via INTRAVENOUS

## 2018-06-07 MED ORDER — MELOXICAM 7.5 MG PO TABS
15.0000 mg | ORAL_TABLET | ORAL | Status: AC
  Administered 2018-06-07: 15 mg via ORAL

## 2018-06-07 MED ORDER — FENTANYL CITRATE (PF) 100 MCG/2ML IJ SOLN: 25.0000 ug | INTRAMUSCULAR | Status: DC | PRN

## 2018-06-07 MED ORDER — BUPIVACAINE-EPINEPHRINE (PF) 0.5% -1:200000 IJ SOLN
INTRAMUSCULAR | Status: DC | PRN
  Administered 2018-06-07: 60 mL

## 2018-06-07 MED ORDER — GABAPENTIN 300 MG PO CAPS
300.0000 mg | ORAL_CAPSULE | ORAL | Status: AC
  Administered 2018-06-07: 300 mg via ORAL

## 2018-06-07 MED ORDER — CEFAZOLIN SODIUM-DEXTROSE 2-4 GM/100ML-% IV SOLN
INTRAVENOUS | Status: AC
  Filled 2018-06-07: qty 100

## 2018-06-07 MED ORDER — EPINEPHRINE PF 1 MG/ML IJ SOLN
INTRAMUSCULAR | Status: AC
  Filled 2018-06-07: qty 2

## 2018-06-07 MED ORDER — CLINDAMYCIN PHOSPHATE 600 MG/50ML IV SOLN
INTRAVENOUS | Status: AC
  Filled 2018-06-07: qty 50

## 2018-06-07 MED ORDER — MELOXICAM 7.5 MG PO TABS
ORAL_TABLET | ORAL | Status: AC
  Filled 2018-06-07: qty 2

## 2018-06-07 MED ORDER — LIDOCAINE HCL (CARDIAC) PF 100 MG/5ML IV SOSY
PREFILLED_SYRINGE | INTRAVENOUS | Status: DC | PRN
  Administered 2018-06-07: 100 mg via INTRAVENOUS

## 2018-06-07 MED ORDER — HYDROCODONE-ACETAMINOPHEN 5-325 MG PO TABS: 1.0000 | ORAL_TABLET | Freq: Four times a day (QID) | ORAL | 0 refills | Status: DC | PRN

## 2018-06-07 MED ORDER — ACETAMINOPHEN 10 MG/ML IV SOLN
INTRAVENOUS | Status: DC | PRN
  Administered 2018-06-07: 1000 mg via INTRAVENOUS

## 2018-06-07 MED ORDER — CLINDAMYCIN PHOSPHATE 600 MG/50ML IV SOLN
600.0000 mg | INTRAVENOUS | Status: AC
  Administered 2018-06-07: 600 mg via INTRAVENOUS

## 2018-06-07 MED ORDER — DEXAMETHASONE SODIUM PHOSPHATE 10 MG/ML IJ SOLN
INTRAMUSCULAR | Status: DC | PRN
  Administered 2018-06-07: 10 mg via INTRAVENOUS

## 2018-06-07 MED ORDER — OXYCODONE HCL 5 MG PO TABS: 5.0000 mg | ORAL_TABLET | Freq: Once | ORAL | Status: DC | PRN

## 2018-06-07 MED ORDER — PROMETHAZINE HCL 25 MG/ML IJ SOLN: 6.2500 mg | INTRAMUSCULAR | Status: DC | PRN

## 2018-06-07 MED ORDER — MIDAZOLAM HCL 2 MG/2ML IJ SOLN
INTRAMUSCULAR | Status: AC
  Filled 2018-06-07: qty 2

## 2018-06-07 MED ORDER — GABAPENTIN 400 MG PO CAPS: 400.0000 mg | ORAL_CAPSULE | Freq: Three times a day (TID) | ORAL | 3 refills | Status: DC

## 2018-06-07 MED ORDER — MELOXICAM 15 MG PO TABS: 15.0000 mg | ORAL_TABLET | Freq: Every day | ORAL | 3 refills | Status: DC

## 2018-06-07 MED ORDER — CHLORHEXIDINE GLUCONATE CLOTH 2 % EX PADS: 6.0000 | MEDICATED_PAD | Freq: Once | CUTANEOUS | Status: DC

## 2018-06-07 MED ORDER — SUCCINYLCHOLINE CHLORIDE 20 MG/ML IJ SOLN
INTRAMUSCULAR | Status: DC | PRN
  Administered 2018-06-07: 140 mg via INTRAVENOUS

## 2018-06-07 MED ORDER — FENTANYL CITRATE (PF) 100 MCG/2ML IJ SOLN
INTRAMUSCULAR | Status: AC
  Filled 2018-06-07: qty 2

## 2018-06-07 MED ORDER — MIDAZOLAM HCL 2 MG/2ML IJ SOLN
INTRAMUSCULAR | Status: DC | PRN
  Administered 2018-06-07: 2 mg via INTRAVENOUS

## 2018-06-07 MED ORDER — BUPIVACAINE HCL (PF) 0.25 % IJ SOLN
INTRAMUSCULAR | Status: AC
  Filled 2018-06-07: qty 30

## 2018-06-07 MED ORDER — PROPOFOL 10 MG/ML IV BOLUS
INTRAVENOUS | Status: AC
  Filled 2018-06-07: qty 20

## 2018-06-07 MED ORDER — MORPHINE SULFATE (PF) 4 MG/ML IV SOLN
INTRAVENOUS | Status: DC | PRN
  Administered 2018-06-07: 4 mg

## 2018-06-07 MED ORDER — FENTANYL CITRATE (PF) 100 MCG/2ML IJ SOLN
INTRAMUSCULAR | Status: DC | PRN
  Administered 2018-06-07: 25 ug via INTRAVENOUS
  Administered 2018-06-07: 50 ug via INTRAVENOUS
  Administered 2018-06-07: 25 ug via INTRAVENOUS

## 2018-06-07 MED ORDER — GABAPENTIN 300 MG PO CAPS
ORAL_CAPSULE | ORAL | Status: AC
  Filled 2018-06-07: qty 1

## 2018-06-07 MED ORDER — BUPIVACAINE-EPINEPHRINE (PF) 0.25% -1:200000 IJ SOLN: INTRAMUSCULAR | Status: DC | PRN

## 2018-06-07 MED ORDER — ROCURONIUM BROMIDE 100 MG/10ML IV SOLN
INTRAVENOUS | Status: DC | PRN
  Administered 2018-06-07: 5 mg via INTRAVENOUS
  Administered 2018-06-07: 45 mg via INTRAVENOUS

## 2018-06-07 MED ORDER — MORPHINE SULFATE (PF) 4 MG/ML IV SOLN
INTRAVENOUS | Status: AC
  Filled 2018-06-07: qty 1

## 2018-06-07 MED ORDER — ONDANSETRON HCL 4 MG/2ML IJ SOLN
INTRAMUSCULAR | Status: DC | PRN
  Administered 2018-06-07: 4 mg via INTRAVENOUS

## 2018-06-07 MED ORDER — LACTATED RINGERS IV SOLN
INTRAVENOUS | Status: DC
  Administered 2018-06-07: 07:00:00 via INTRAVENOUS

## 2018-06-07 MED ORDER — SUGAMMADEX SODIUM 200 MG/2ML IV SOLN
INTRAVENOUS | Status: DC | PRN
  Administered 2018-06-07: 242 mg via INTRAVENOUS

## 2018-06-07 MED ORDER — MEPERIDINE HCL 50 MG/ML IJ SOLN: 6.2500 mg | INTRAMUSCULAR | Status: DC | PRN

## 2018-06-07 MED ORDER — OXYCODONE HCL 5 MG/5ML PO SOLN: 5.0000 mg | Freq: Once | ORAL | Status: DC | PRN

## 2018-06-07 MED ORDER — BUPIVACAINE HCL (PF) 0.5 % IJ SOLN
INTRAMUSCULAR | Status: AC
  Filled 2018-06-07: qty 60

## 2018-06-07 MED ORDER — HYDROCODONE-ACETAMINOPHEN 5-325 MG PO TABS: 1.0000 | ORAL_TABLET | ORAL | Status: DC | PRN

## 2018-06-07 MED ORDER — CEFAZOLIN SODIUM-DEXTROSE 2-4 GM/100ML-% IV SOLN
2.0000 g | INTRAVENOUS | Status: AC
  Administered 2018-06-07: 2 g via INTRAVENOUS

## 2018-06-07 SURGICAL SUPPLY — 29 items
ADAPTER IRRIG TUBE 2 SPIKE SOL (ADAPTER) ×6 IMPLANT
BLADE AGGRESSIVE PLUS 4.0 (BLADE) IMPLANT
BUR RADIUS 4.0X18.5 (BURR) ×3 IMPLANT
CHLORAPREP W/TINT 26ML (MISCELLANEOUS) ×3 IMPLANT
COVER WAND RF STERILE (DRAPES) ×3 IMPLANT
CUFF TOURN 24 STER (MISCELLANEOUS) IMPLANT
CUFF TOURN 30 STER DUAL PORT (MISCELLANEOUS) IMPLANT
CUTTER SLOTTED WHISKER 4.0 (BURR) IMPLANT
GAUZE SPONGE 4X4 12PLY STRL (GAUZE/BANDAGES/DRESSINGS) ×3 IMPLANT
GLOVE BIO SURGEON STRL SZ8 (GLOVE) ×3 IMPLANT
GOWN STRL REUS W/ TWL LRG LVL3 (GOWN DISPOSABLE) ×2 IMPLANT
GOWN STRL REUS W/TWL LRG LVL3 (GOWN DISPOSABLE) ×4
GOWN STRL REUS W/TWL LRG LVL4 (GOWN DISPOSABLE) ×3 IMPLANT
IV LACTATED RINGER IRRG 3000ML (IV SOLUTION) ×12
IV LR IRRIG 3000ML ARTHROMATIC (IV SOLUTION) ×6 IMPLANT
KIT TURNOVER KIT A (KITS) ×3 IMPLANT
MANIFOLD NEPTUNE II (INSTRUMENTS) ×3 IMPLANT
MAT ABSORB  FLUID 56X50 GRAY (MISCELLANEOUS) ×2
MAT ABSORB FLUID 56X50 GRAY (MISCELLANEOUS) ×1 IMPLANT
NDL SAFETY ECLIPSE 18X1.5 (NEEDLE) ×2 IMPLANT
NEEDLE HYPO 18GX1.5 SHARP (NEEDLE) ×4
PACK ARTHROSCOPY KNEE (MISCELLANEOUS) ×3 IMPLANT
SET TUBE SUCT SHAVER OUTFL 24K (TUBING) ×3 IMPLANT
SOL PREP PVP 2OZ (MISCELLANEOUS) ×3
SOLUTION PREP PVP 2OZ (MISCELLANEOUS) ×1 IMPLANT
SUT ETHILON 3 0 FSLX (SUTURE) ×3 IMPLANT
SYR 30ML LL (SYRINGE) ×6 IMPLANT
TUBING ARTHRO INFLOW-ONLY STRL (TUBING) ×3 IMPLANT
WAND COBLATION FLOW 50 (SURGICAL WAND) ×3 IMPLANT

## 2018-06-07 NOTE — Transfer of Care (Signed)
Immediate Anesthesia Transfer of Care Note  Patient: Allison Mooney  Procedure(s) Performed: ARTHROSCOPY KNEE WITH MEDIAL AND LATERAL MENISCAL REPAIR-LEFT (Left )  Patient Location: PACU  Anesthesia Type:General  Level of Consciousness: sedated  Airway & Oxygen Therapy: Patient Spontanous Breathing and Patient connected to face mask oxygen  Post-op Assessment: Report given to RN and Post -op Vital signs reviewed and stable  Post vital signs: Reviewed and stable  Last Vitals:  Vitals Value Taken Time  BP    Temp    Pulse 89 06/07/2018  9:37 AM  Resp 15 06/07/2018  9:37 AM  SpO2 100 % 06/07/2018  9:37 AM  Vitals shown include unvalidated device data.  Last Pain:  Vitals:   06/07/18 0616  TempSrc: Tympanic  PainSc: 7          Complications: No apparent anesthesia complications

## 2018-06-07 NOTE — Anesthesia Post-op Follow-up Note (Signed)
Anesthesia QCDR form completed.        

## 2018-06-07 NOTE — Anesthesia Preprocedure Evaluation (Signed)
Anesthesia Evaluation  Patient identified by MRN, date of birth, ID band Patient awake    Reviewed: Allergy & Precautions, NPO status , Patient's Chart, lab work & pertinent test results  History of Anesthesia Complications Negative for: history of anesthetic complications  Airway Mallampati: III  TM Distance: >3 FB Neck ROM: Full    Dental no notable dental hx.    Pulmonary neg pulmonary ROS, neg sleep apnea, neg COPD,    breath sounds clear to auscultation- rhonchi (-) wheezing      Cardiovascular Exercise Tolerance: Good (-) hypertension(-) CAD, (-) Past MI, (-) Cardiac Stents and (-) CABG  Rhythm:Regular Rate:Normal - Systolic murmurs and - Diastolic murmurs    Neuro/Psych neg Seizures negative neurological ROS  negative psych ROS   GI/Hepatic Neg liver ROS, GERD  ,  Endo/Other  negative endocrine ROSneg diabetes  Renal/GU negative Renal ROS     Musculoskeletal  (+) Arthritis ,   Abdominal (+) + obese,   Peds  Hematology negative hematology ROS (+)   Anesthesia Other Findings Past Medical History: No date: Arthritis No date: Family history of adverse reaction to anesthesia     Comment:  MOM-DISORIENTED No date: GERD (gastroesophageal reflux disease)   Reproductive/Obstetrics                             Anesthesia Physical Anesthesia Plan  ASA: II  Anesthesia Plan: General   Post-op Pain Management:    Induction: Intravenous  PONV Risk Score and Plan: 2 and Ondansetron, Dexamethasone and Midazolam  Airway Management Planned: Oral ETT  Additional Equipment:   Intra-op Plan:   Post-operative Plan: Extubation in OR  Informed Consent: I have reviewed the patients History and Physical, chart, labs and discussed the procedure including the risks, benefits and alternatives for the proposed anesthesia with the patient or authorized representative who has indicated his/her  understanding and acceptance.     Dental advisory given  Plan Discussed with: CRNA and Anesthesiologist  Anesthesia Plan Comments:         Anesthesia Quick Evaluation

## 2018-06-07 NOTE — Anesthesia Procedure Notes (Signed)
Procedure Name: Intubation Date/Time: 06/07/2018 8:04 AM Performed by: Nelda Marseille, CRNA Pre-anesthesia Checklist: Patient identified, Patient being monitored, Timeout performed, Emergency Drugs available and Suction available Patient Re-evaluated:Patient Re-evaluated prior to induction Oxygen Delivery Method: Circle system utilized Preoxygenation: Pre-oxygenation with 100% oxygen Induction Type: IV induction Ventilation: Mask ventilation without difficulty Laryngoscope Size: Mac, 3 and McGraph Grade View: Grade I Tube type: Oral Tube size: 7.0 mm Number of attempts: 1 Airway Equipment and Method: Stylet Placement Confirmation: ETT inserted through vocal cords under direct vision,  positive ETCO2 and breath sounds checked- equal and bilateral Secured at: 21 cm Tube secured with: Tape Dental Injury: Teeth and Oropharynx as per pre-operative assessment  Difficulty Due To: Difficulty was anticipated, Difficult Airway- due to large tongue and Difficult Airway- due to limited oral opening

## 2018-06-07 NOTE — H&P (Signed)
THE PATIENT WAS SEEN PRIOR TO SURGERY TODAY.  HISTORY, ALLERGIES, HOME MEDICATIONS AND OPERATIVE PROCEDURE WERE REVIEWED. RISKS AND BENEFITS OF SURGERY DISCUSSED WITH PATIENT AGAIN.  NO CHANGES FROM INITIAL HISTORY AND PHYSICAL NOTED.    

## 2018-06-07 NOTE — Op Note (Signed)
06/07/2018  9:27 AM  PATIENT:  Allison Mooney    PRE-OPERATIVE DIAGNOSIS: Lateral MENISCAL TEAR LEFT KNEE with chondral damage lateral femoral condyle.  Patellofemoral chondromalacia.  POST-OPERATIVE DIAGNOSIS:  Same  PROCEDURE:  ARTHROSCOPY KNEE WITH   LATERAL MENISCECTOMY AND CHONDROPLASTY-LEFT  SURGEON:  Valinda Hoar, MD  COMPLICATIONS:   None  EBL: Minimal  TOURNIQUET TIME:   None     .  ANESTHESIA: General LMA  PREOPERATIVE INDICATIONS:  Allison Mooney is a  38 y.o. female with a diagnosis of lateral MENISCAL TEAR LEFT KNEE who failed conservative measures and elected for surgical management.    The risks benefits and alternatives were discussed with the patient preoperatively including but not limited to the risks of infection, bleeding, nerve injury, cardiopulmonary complications, the need for revision surgery, among others, and the patient was willing to proceed.  OPERATIVE IMPLANTS: None  OPERATIVE FINDINGS: New large flap tear posterior horn of the lateral meniscus and tearing of the anterior lateral meniscus.  New chondromalacia lateral femoral condyle.  Medial meniscus and condyles normal.  Grade III chondromalacia patellofemoral region  OPERATIVE PROCEDURE: The patient was brought to the operating room and underwent satisfactory general LMA anesthesia in the supine position.  The leg was prepped and draped in a sterile fashion.  Arthroscopy was carried out through standard portals.  The above findings were encountered upon arthroscopy.  The large flap tear of the lateral meniscus was debrided with basket forceps motorized resector and ArthroCare wand.  This was taken back to good healthy tissue.  The anterior and anteromedial aspect the meniscus was torn as well and this was debrided below with a similar instruments.  The lesion on the lateral femoral condyle was debrided gently with the ArthroCare wand and the lowest setting.  The patellofemoral chondromalacia was  also treated in this fashion.  This provided stable meniscal surfaces and smooth chondral surfaces as much as could be obtained.  All loose bodies were suctioned out of the joint.  Pump pressure was decreased to allow for cauterization of bleeders.  Joint was then thoroughly irrigated.   Once this was completed and stabilized the joint was thoroughly irrigated.  Instruments were removed and knee wounds closed with 3-0 nylon.  Sponge and needle counts were correct. A dry sterile dressing was applied.  Patient was awakened and taken to recovery in good condition.   Valinda Hoar, MD

## 2018-06-07 NOTE — Anesthesia Postprocedure Evaluation (Signed)
Anesthesia Post Note  Patient: SAQUANA MARUSKA  Procedure(s) Performed: ARTHROSCOPY KNEE WITH MEDIAL AND LATERAL MENISCAL REPAIR-LEFT (Left )  Patient location during evaluation: PACU Anesthesia Type: General Level of consciousness: awake and alert and oriented Pain management: pain level controlled Vital Signs Assessment: post-procedure vital signs reviewed and stable Respiratory status: spontaneous breathing, nonlabored ventilation and respiratory function stable Cardiovascular status: blood pressure returned to baseline and stable Postop Assessment: no signs of nausea or vomiting Anesthetic complications: no     Last Vitals:  Vitals:   06/07/18 1019 06/07/18 1100  BP: 126/78 (!) 141/79  Pulse: 77 78  Resp: 16 16  Temp: 36.6 C   SpO2: 98% 100%    Last Pain:  Vitals:   06/07/18 1019  TempSrc: Oral  PainSc: 0-No pain                 Valecia Beske

## 2018-06-07 NOTE — Discharge Instructions (Signed)
AMBULATORY SURGERY  °DISCHARGE INSTRUCTIONS ° ° °1) The drugs that you were given will stay in your system until tomorrow so for the next 24 hours you should not: ° °A) Drive an automobile °B) Make any legal decisions °C) Drink any alcoholic beverage ° ° °2) You may resume regular meals tomorrow.  Today it is better to start with liquids and gradually work up to solid foods. ° °You may eat anything you prefer, but it is better to start with liquids, then soup and crackers, and gradually work up to solid foods. ° ° °3) Please notify your doctor immediately if you have any unusual bleeding, trouble breathing, redness and pain at the surgery site, drainage, fever, or pain not relieved by medication. ° ° ° °4) Additional Instructions: ° ° ° ° ° ° ° °Please contact your physician with any problems or Same Day Surgery at [phone], Monday through Friday 6 am to 4 pm, or [place] [facility] [place] Main number at [phone]. °

## 2018-08-17 ENCOUNTER — Other Ambulatory Visit: Payer: Self-pay

## 2018-08-17 ENCOUNTER — Ambulatory Visit (INDEPENDENT_AMBULATORY_CARE_PROVIDER_SITE_OTHER): Payer: 59

## 2018-08-17 DIAGNOSIS — Z3042 Encounter for surveillance of injectable contraceptive: Secondary | ICD-10-CM | POA: Diagnosis not present

## 2018-08-17 MED ORDER — MEDROXYPROGESTERONE ACETATE 150 MG/ML IM SUSP
150.0000 mg | Freq: Once | INTRAMUSCULAR | Status: AC
  Administered 2018-08-17: 150 mg via INTRAMUSCULAR

## 2018-10-18 ENCOUNTER — Ambulatory Visit: Payer: 59 | Admitting: Obstetrics & Gynecology

## 2018-10-29 ENCOUNTER — Other Ambulatory Visit (HOSPITAL_COMMUNITY)
Admission: RE | Admit: 2018-10-29 | Discharge: 2018-10-29 | Disposition: A | Discharge status: Home / Self Care | Payer: 59 | Source: Ambulatory Visit | Attending: Maternal Newborn | Admitting: Maternal Newborn

## 2018-10-29 ENCOUNTER — Encounter: Payer: Self-pay | Admitting: Maternal Newborn

## 2018-10-29 ENCOUNTER — Ambulatory Visit (INDEPENDENT_AMBULATORY_CARE_PROVIDER_SITE_OTHER): Payer: 59 | Admitting: Maternal Newborn

## 2018-10-29 ENCOUNTER — Other Ambulatory Visit: Payer: Self-pay

## 2018-10-29 VITALS — BP 140/80 | Ht 68.0 in | Wt 275.8 lb

## 2018-10-29 DIAGNOSIS — N761 Subacute and chronic vaginitis: Secondary | ICD-10-CM | POA: Insufficient documentation

## 2018-10-29 DIAGNOSIS — R300 Dysuria: Secondary | ICD-10-CM | POA: Diagnosis not present

## 2018-10-29 LAB — POCT URINALYSIS DIPSTICK
Bilirubin, UA: NEGATIVE
Blood, UA: NEGATIVE
Glucose, UA: NEGATIVE
Ketones, UA: NEGATIVE
Nitrite, UA: NEGATIVE
Protein, UA: POSITIVE — AB
Spec Grav, UA: 1.01 (ref 1.010–1.025)
Urobilinogen, UA: 0.2 E.U./dL
pH, UA: 5 (ref 5.0–8.0)

## 2018-10-29 NOTE — Progress Notes (Signed)
Obstetrics & Gynecology Office Visit   Chief Complaint:  Chief Complaint  Patient presents with  . Bacterial Vaginosis    pt says she has burning sensation when she urinates and she is currently having it, plus some vaginal itchiness/little irritation x couple of weeks    History of Present Illness: Allison Mooney reports that she has been having some intermittent dysuria for around two weeks and she noticed pain when she wipes after voiding. She has had a clear to slightly white vaginal discharge, moderate in amount, with no odor that comes and goes. She also has vulvar itching and mild irritation that has been going on during this same time period. She has had chronic vaginitis in the past and is concerned that it is returning. She has not had any changes in partner or personal care products. She does not want STI testing today.  Review of Systems  Constitutional: Negative.   HENT: Negative.   Eyes: Negative.   Respiratory: Negative.   Cardiovascular: Negative.   Gastrointestinal: Negative.   Genitourinary: Positive for dysuria and frequency.       Vaginal discharge, dyspareunia  Musculoskeletal: Positive for joint pain.       Joint swelling  Skin: Negative.   Neurological: Negative.   Endo/Heme/Allergies: Positive for environmental allergies.  Psychiatric/Behavioral: Negative.   All other systems reviewed and are negative.   Past Medical History:  Past Medical History:  Diagnosis Date  . Arthritis   . Family history of adverse reaction to anesthesia    MOM-DISORIENTED  . GERD (gastroesophageal reflux disease)     Past Surgical History:  Past Surgical History:  Procedure Laterality Date  . CARPAL TUNNEL RELEASE Bilateral   . CERVICAL CONE BIOPSY    . CHOLECYSTECTOMY    . KNEE ARTHROSCOPY Left 06/07/2018   Procedure: ARTHROSCOPY KNEE WITH MEDIAL AND LATERAL MENISCAL REPAIR-LEFT;  Surgeon: Earnestine Leys, MD;  Location: [facility];  Service: Orthopedics;  Laterality: Left;  .  KNEE ARTHROSCOPY WITH MENISCAL REPAIR Left 08/10/2017   Procedure: KNEE ARTHROSCOPY WITH MENISCAL REPAIR;  Surgeon: Earnestine Leys, MD;  Location: [facility];  Service: Orthopedics;  Laterality: Left;    Gynecologic History: No LMP recorded. Patient has had an injection.  Obstetric History: G1P1001  Family History:  History reviewed. No pertinent family history.  Social History:  Social History   Socioeconomic History  . Marital status: Married    Spouse name: Not on file  . Number of children: Not on file  . Years of education: Not on file  . Highest education level: Not on file  Occupational History  . Not on file  Social Needs  . Financial resource strain: Not on file  . Food insecurity    Worry: Not on file    Inability: Not on file  . Transportation needs    Medical: Not on file    Non-medical: Not on file  Tobacco Use  . Smoking status: Never Smoker  . Smokeless tobacco: Never Used  Substance and Sexual Activity  . Alcohol use: Never    Frequency: Never  . Drug use: Never  . Sexual activity: Yes    Birth control/protection: Injection  Lifestyle  . Physical activity    Days per week: Not on file    Minutes per session: Not on file  . Stress: Not on file  Relationships  . Social Herbalist on phone: Not on file    Gets together: Not on file  Attends religious service: Not on file    Active member of club or organization: Not on file    Attends meetings of clubs or organizations: Not on file    Relationship status: Not on file  . Intimate partner violence    Fear of current or ex partner: Not on file    Emotionally abused: Not on file    Physically abused: Not on file    Forced sexual activity: Not on file  Other Topics Concern  . Not on file  Social History Narrative  . Not on file    Allergies:  Allergies  Allergen Reactions  . Fluconazole Itching, Rash and Other (See Comments)  . Penicillins Itching and Rash    Has patient had a PCN  reaction causing immediate rash, facial/tongue/throat swelling, SOB or lightheadedness with hypotension: Yes Has patient had a PCN reaction causing severe rash involving mucus membranes or skin necrosis: No Has patient had a PCN reaction that required hospitalization: Yes Has patient had a PCN reaction occurring within the last 10 years: Yes If all of the above answers are "NO", then may proceed with Cephalosporin use.   . Sulfa Antibiotics Itching and Rash    Medications: Prior to Admission medications   Medication Sig Start Date End Date Taking? Authorizing Provider  acetaminophen-codeine (TYLENOL #3) 300-30 MG tablet Take 1 tablet by mouth daily as needed for moderate pain.   Yes [provider]  esomeprazole (NEXIUM) 20 MG capsule Take 20 mg by mouth daily at 12 noon.   Yes [provider]  gabapentin (NEURONTIN) 300 MG capsule gabapentin 300 mg capsule  TAKE (1) CAPSULE BY MOUTH THREE TIMES DAILY   Yes [provider]  medroxyPROGESTERone (DEPO-PROVERA) 150 MG/ML injection Inject 1 mL (150 mg total) into the muscle every 3 (three) months. 05/25/18  Yes Vena AustriaStaebler, Andreas, MD  meloxicam (MOBIC) 15 MG tablet Take 1 tablet (15 mg total) by mouth daily. 06/07/18  Yes Deeann SaintMiller, Howard, MD  Probiotic CAPS Take 1 capsule by mouth daily at 12 noon.   Yes [provider]  traMADol (ULTRAM) 50 MG tablet Take 50 mg by mouth every 6 (six) hours as needed for moderate pain.  12/25/17  Yes [provider]    Physical Exam Vitals:  Vitals:   10/29/18 1455  BP: 140/80   No LMP recorded. Patient has had an injection.  General: NAD HEENT: normocephalic, anicteric Pulmonary: No increased work of breathing Genitourinary:  External: External vulvar irritation. Otherwise, normal  external female genitalia.  Normal urethral meatus, normal  Bartholin's and Skene's glands.    Vagina: Normal vaginal mucosa, no evidence of prolapse.   Neurologic: Grossly intact  Psychiatric: mood appropriate, affect full  UA with moderate leukocytes.  Assessment: 38 y.o. G1P1001 with vaginal discharge/irritation, possible UTI symptoms.  Plan: Problem List Items Addressed This Visit    None    Visit Diagnoses    Dysuria    -  Primary   Relevant Orders   Urine Culture (Completed)   POCT Urinalysis Dipstick (Completed)   Chronic vaginitis       Relevant Orders   Cervicovaginal ancillary only (Completed)     1) Send urine culture due to leukocytes on UA/symptoms. 2) Aptima for returning vaginitis symptoms.  Marcelyn BruinsJacelyn Schmid, CNM 10/29/2018  3:03 PM

## 2018-10-31 LAB — URINE CULTURE: Organism ID, Bacteria: NO GROWTH

## 2018-11-02 ENCOUNTER — Telehealth: Payer: Self-pay

## 2018-11-02 LAB — CERVICOVAGINAL ANCILLARY ONLY
Bacterial vaginitis: NEGATIVE
Candida vaginitis: NEGATIVE

## 2018-11-02 NOTE — Telephone Encounter (Signed)
Pt calling for results from last Friday.  [phone]  Adv pt of negative results.

## 2018-11-03 ENCOUNTER — Encounter: Payer: Self-pay | Admitting: Maternal Newborn

## 2018-11-09 ENCOUNTER — Other Ambulatory Visit: Payer: Self-pay

## 2018-11-09 ENCOUNTER — Ambulatory Visit (INDEPENDENT_AMBULATORY_CARE_PROVIDER_SITE_OTHER): Payer: 59

## 2018-11-09 DIAGNOSIS — Z3042 Encounter for surveillance of injectable contraceptive: Secondary | ICD-10-CM | POA: Diagnosis not present

## 2018-11-09 MED ORDER — MEDROXYPROGESTERONE ACETATE 150 MG/ML IM SUSP
150.0000 mg | Freq: Once | INTRAMUSCULAR | Status: AC
  Administered 2018-11-09: 09:00:00 150 mg via INTRAMUSCULAR

## 2018-11-09 NOTE — Progress Notes (Signed)
Pt here for depo inj which was given IM left deltoid.  NDC# 1610-9604-54

## 2018-12-21 ENCOUNTER — Ambulatory Visit (INDEPENDENT_AMBULATORY_CARE_PROVIDER_SITE_OTHER): Payer: 59 | Admitting: Obstetrics and Gynecology

## 2018-12-21 ENCOUNTER — Other Ambulatory Visit (HOSPITAL_COMMUNITY)
Admission: RE | Admit: 2018-12-21 | Discharge: 2018-12-21 | Disposition: A | Discharge status: Home / Self Care | Payer: 59 | Source: Ambulatory Visit | Attending: Obstetrics and Gynecology | Admitting: Obstetrics and Gynecology

## 2018-12-21 ENCOUNTER — Encounter: Payer: Self-pay | Admitting: Obstetrics and Gynecology

## 2018-12-21 ENCOUNTER — Other Ambulatory Visit: Payer: Self-pay

## 2018-12-21 VITALS — BP 132/82 | HR 88 | Ht 68.0 in | Wt 276.0 lb

## 2018-12-21 DIAGNOSIS — Z01419 Encounter for gynecological examination (general) (routine) without abnormal findings: Secondary | ICD-10-CM

## 2018-12-21 DIAGNOSIS — Z23 Encounter for immunization: Secondary | ICD-10-CM

## 2018-12-21 DIAGNOSIS — Z124 Encounter for screening for malignant neoplasm of cervix: Secondary | ICD-10-CM | POA: Diagnosis present

## 2018-12-21 DIAGNOSIS — Z3042 Encounter for surveillance of injectable contraceptive: Secondary | ICD-10-CM

## 2018-12-21 DIAGNOSIS — Z1239 Encounter for other screening for malignant neoplasm of breast: Secondary | ICD-10-CM

## 2018-12-21 MED ORDER — MEDROXYPROGESTERONE ACETATE 150 MG/ML IM SUSP: 150.0000 mg | INTRAMUSCULAR | 3 refills | Status: DC

## 2018-12-21 NOTE — Progress Notes (Signed)
Gynecology Annual Exam   PCP: System, Pcp Not In  Chief Complaint:  Chief Complaint  Patient presents with  . Gynecologic Exam    History of Present Illness: Patient is a 38 y.o. G1P1001 presents for annual exam. The patient has no complaints today.   LMP: No LMP recorded (exact date). Patient has had an injection. Absent on depo provera  The patient is sexually active. She currently uses Depo-Provera injections for contraception. She denies dyspareunia.  The patient does perform self breast exams.  There is no notable family history of breast or ovarian cancer in her family.  The patient wears seatbelts: yes.   The patient has regular exercise: not asked.    The patient denies current symptoms of depression.    Review of Systems: Review of Systems  Constitutional: Negative for chills and fever.  HENT: Negative for congestion.   Respiratory: Negative for cough and shortness of breath.   Cardiovascular: Negative for chest pain and palpitations.  Gastrointestinal: Negative for abdominal pain, constipation, diarrhea, heartburn, nausea and vomiting.  Genitourinary: Negative for dysuria, frequency and urgency.  Skin: Negative for itching and rash.  Neurological: Negative for dizziness and headaches.  Endo/Heme/Allergies: Negative for polydipsia.  Psychiatric/Behavioral: Negative for depression.    Past Medical History:  Past Medical History:  Diagnosis Date  . Arthritis   . Family history of adverse reaction to anesthesia    MOM-DISORIENTED  . GERD (gastroesophageal reflux disease)     Past Surgical History:  Past Surgical History:  Procedure Laterality Date  . CARPAL TUNNEL RELEASE Bilateral   . CERVICAL CONE BIOPSY    . CHOLECYSTECTOMY    . KNEE ARTHROSCOPY Left 06/07/2018   Procedure: ARTHROSCOPY KNEE WITH MEDIAL AND LATERAL MENISCAL REPAIR-LEFT;  Surgeon: Earnestine Leys, MD;  Location: [facility];  Service: Orthopedics;  Laterality: Left;  . KNEE ARTHROSCOPY WITH  MENISCAL REPAIR Left 08/10/2017   Procedure: KNEE ARTHROSCOPY WITH MENISCAL REPAIR;  Surgeon: Earnestine Leys, MD;  Location: [facility];  Service: Orthopedics;  Laterality: Left;    Gynecologic History:  No LMP recorded (exact date). Patient has had an injection. Contraception: Depo-Provera injections Last Pap: Results were: 11/24/2017 NIL and HR HPV+ and HPV 16 positive 12/31/2017 Colposcopy CIN ectocervix, ECC negative  Obstetric History: G1P1001  Family History:  History reviewed. No pertinent family history.  Social History:  Social History   Socioeconomic History  . Marital status: Married    Spouse name: Not on file  . Number of children: Not on file  . Years of education: Not on file  . Highest education level: Not on file  Occupational History  . Not on file  Social Needs  . Financial resource strain: Not on file  . Food insecurity    Worry: Not on file    Inability: Not on file  . Transportation needs    Medical: Not on file    Non-medical: Not on file  Tobacco Use  . Smoking status: Never Smoker  . Smokeless tobacco: Never Used  Substance and Sexual Activity  . Alcohol use: Never    Frequency: Never  . Drug use: Never  . Sexual activity: Yes    Birth control/protection: Injection  Lifestyle  . Physical activity    Days per week: Not on file    Minutes per session: Not on file  . Stress: Not on file  Relationships  . Social Herbalist on phone: Not on file    Gets together:  Not on file    Attends religious service: Not on file    Active member of club or organization: Not on file    Attends meetings of clubs or organizations: Not on file    Relationship status: Not on file  . Intimate partner violence    Fear of current or ex partner: Not on file    Emotionally abused: Not on file    Physically abused: Not on file    Forced sexual activity: Not on file  Other Topics Concern  . Not on file  Social History Narrative  . Not on file     Allergies:  Allergies  Allergen Reactions  . Fluconazole Itching, Rash and Other (See Comments)  . Penicillins Itching and Rash    Has patient had a PCN reaction causing immediate rash, facial/tongue/throat swelling, SOB or lightheadedness with hypotension: Yes Has patient had a PCN reaction causing severe rash involving mucus membranes or skin necrosis: No Has patient had a PCN reaction that required hospitalization: Yes Has patient had a PCN reaction occurring within the last 10 years: Yes If all of the above answers are "NO", then may proceed with Cephalosporin use.   . Sulfa Antibiotics Itching and Rash    Medications: Prior to Admission medications   Medication Sig Start Date End Date Taking? Authorizing Provider  acetaminophen-codeine (TYLENOL #3) 300-30 MG tablet Take 1 tablet by mouth daily as needed for moderate pain.    [provider]  esomeprazole (NEXIUM) 20 MG capsule Take 20 mg by mouth daily at 12 noon.    [provider]  gabapentin (NEURONTIN) 300 MG capsule gabapentin 300 mg capsule  TAKE (1) CAPSULE BY MOUTH THREE TIMES DAILY    [provider]  medroxyPROGESTERone (DEPO-PROVERA) 150 MG/ML injection Inject 1 mL (150 mg total) into the muscle every 3 (three) months. 05/25/18   Vena AustriaStaebler, Ruthvik Barnaby, MD  meloxicam (MOBIC) 15 MG tablet Take 1 tablet (15 mg total) by mouth daily. 06/07/18   Deeann SaintMiller, Howard, MD  Probiotic CAPS Take 1 capsule by mouth daily at 12 noon.    [provider]  traMADol (ULTRAM) 50 MG tablet Take 50 mg by mouth every 6 (six) hours as needed for moderate pain.  12/25/17   [provider]    Physical Exam Vitals: Blood pressure 132/82, pulse 88, height 5\' 8"  (1.727 m), weight 276 lb (125.2 kg).   General: NAD HEENT: normocephalic, anicteric Thyroid: no enlargement, no palpable nodules Pulmonary: No increased work of breathing, CTAB Cardiovascular: RRR, distal pulses 2+ Breast: Breast symmetrical, no  tenderness, no palpable nodules or masses, no skin or nipple retraction present, no nipple discharge.  No axillary or supraclavicular lymphadenopathy. Abdomen: NABS, soft, non-tender, non-distended.  Umbilicus without lesions.  No hepatomegaly, splenomegaly or masses palpable. No evidence of hernia  Genitourinary:  External: Normal external female genitalia.  Normal urethral meatus, normal Bartholin's and Skene's glands.    Vagina: Normal vaginal mucosa, no evidence of prolapse.    Cervix: Grossly normal in appearance, no bleeding  Uterus: Non-enlarged, mobile, normal contour.  No CMT  Adnexa: ovaries non-enlarged, no adnexal masses  Rectal: deferred  Lymphatic: no evidence of inguinal lymphadenopathy Extremities: no edema, erythema, or tenderness Neurologic: Grossly intact Psychiatric: mood appropriate, affect full  Female chaperone present for pelvic and breast  portions of the physical exam      Assessment: 38 y.o. G1P1001 routine annual exam  Plan: Problem List Items Addressed This Visit    None  Visit Diagnoses    Encounter for gynecological examination without abnormal finding    -  Primary   Relevant Orders   Cytology - PAP   Screening for malignant neoplasm of cervix       Relevant Orders   Cytology - PAP   Breast screening       Flu vaccine need       Relevant Orders   Flu Vaccine QUAD 36+ mos IM (Completed)   Encounter for surveillance of injectable contraceptive          1) STI screening  was notoffered and therefore not obtained  2)  ASCCP guidelines and rational discussed.  Patient opts for every 3 years screening interval - follow up today of CIN I last year   3) Contraception - the patient is currently using  Depo-Provera injections.  She is happy with her current form of contraception and plans to continue  4) Routine healthcare maintenance including cholesterol, diabetes screening discussed managed by PCP  5) Return in about 1 year (around  12/21/2019) for annual, 3 month nurse visits for depo injection.   Vena Austria, MD, [facility], [facility] 12/21/2018, 10:11 AM

## 2019-01-25 ENCOUNTER — Telehealth: Payer: Self-pay | Admitting: Obstetrics and Gynecology

## 2019-01-25 NOTE — Telephone Encounter (Signed)
Received a message from [facility]. Pap has been completed but not sure why they are not showing in Epic. She is faxing results to [facility]. We will call pt once recveived

## 2019-01-25 NOTE — Telephone Encounter (Signed)
Allison Mooney from [facility] states sheis unsure why Pap is not going through Epic, she did say the pap was negative. Pt aware

## 2019-01-25 NOTE — Telephone Encounter (Signed)
Patient had her annual 9/15 and has not heard regarding results of pap, would like a call with these results.

## 2019-01-25 NOTE — Telephone Encounter (Signed)
I have called [facility] regarding status of the Pap result from 9/15. Pamala Hurry will check and call me back.

## 2019-02-01 ENCOUNTER — Other Ambulatory Visit: Payer: Self-pay

## 2019-02-01 ENCOUNTER — Ambulatory Visit (INDEPENDENT_AMBULATORY_CARE_PROVIDER_SITE_OTHER): Payer: 59

## 2019-02-01 DIAGNOSIS — Z3042 Encounter for surveillance of injectable contraceptive: Secondary | ICD-10-CM | POA: Diagnosis not present

## 2019-02-01 MED ORDER — MEDROXYPROGESTERONE ACETATE 150 MG/ML IM SUSP
150.0000 mg | Freq: Once | INTRAMUSCULAR | Status: AC
  Administered 2019-02-01: 150 mg via INTRAMUSCULAR

## 2019-03-15 ENCOUNTER — Telehealth: Payer: Self-pay

## 2019-03-15 NOTE — Telephone Encounter (Signed)
Please advise 

## 2019-03-15 NOTE — Telephone Encounter (Signed)
Pt calling; was told earlier this year if she had any issues to call and rx would be sent in; has BV and a yeast inf c it.  Would like rx like before - pill form for BV and cream form for yeast inf.  [phone]

## 2019-03-16 NOTE — Telephone Encounter (Signed)
Patient is calling to follow up on request. Patient aware [facility] is seeing patient's at this time.

## 2019-03-21 ENCOUNTER — Other Ambulatory Visit: Payer: Self-pay | Admitting: Obstetrics & Gynecology

## 2019-03-21 MED ORDER — TERCONAZOLE 0.4 % VA CREA: 1.0000 | TOPICAL_CREAM | Freq: Every day | VAGINAL | 0 refills | Status: DC

## 2019-03-21 MED ORDER — METRONIDAZOLE 500 MG PO TABS: 500.0000 mg | ORAL_TABLET | Freq: Two times a day (BID) | ORAL | 0 refills | Status: DC

## 2019-03-21 NOTE — Telephone Encounter (Signed)
OK, address w [facility] upon return to see where things were missed or could improve (6 days ample time to respond to this) I will call in Rx for now

## 2019-03-21 NOTE — Telephone Encounter (Signed)
Pt f/u on message from last week. She hasn't received a return call. She has a yeast infection. [facility] told her she wouldn't have to come in, he would send rx in for her. [phone]

## 2019-03-21 NOTE — Telephone Encounter (Signed)
Patient aware of Flagyl & Terazol Cream sent in.

## 2019-04-26 ENCOUNTER — Ambulatory Visit: Payer: 59

## 2019-05-05 ENCOUNTER — Ambulatory Visit: Payer: 59

## 2019-05-05 ENCOUNTER — Other Ambulatory Visit: Payer: Self-pay

## 2019-05-10 ENCOUNTER — Other Ambulatory Visit: Payer: Self-pay

## 2019-05-10 ENCOUNTER — Ambulatory Visit (INDEPENDENT_AMBULATORY_CARE_PROVIDER_SITE_OTHER): Payer: No Typology Code available for payment source

## 2019-05-10 DIAGNOSIS — Z3202 Encounter for pregnancy test, result negative: Secondary | ICD-10-CM

## 2019-05-10 DIAGNOSIS — Z3042 Encounter for surveillance of injectable contraceptive: Secondary | ICD-10-CM

## 2019-05-10 DIAGNOSIS — N912 Amenorrhea, unspecified: Secondary | ICD-10-CM

## 2019-05-10 LAB — POCT URINE PREGNANCY: Preg Test, Ur: NEGATIVE

## 2019-05-10 MED ORDER — MEDROXYPROGESTERONE ACETATE 150 MG/ML IM SUSP
150.0000 mg | Freq: Once | INTRAMUSCULAR | Status: AC
  Administered 2019-05-10: 09:00:00 150 mg via INTRAMUSCULAR

## 2019-05-10 NOTE — Progress Notes (Signed)
Patient presents today for Depo Provera injection outside of date range. Patient not currently on menses. Pregnancy test performed per protocol (negative).Given IM Left Deltoid per patient request. Patient tolerated well.

## 2019-08-02 ENCOUNTER — Other Ambulatory Visit: Payer: Self-pay

## 2019-08-02 ENCOUNTER — Ambulatory Visit (INDEPENDENT_AMBULATORY_CARE_PROVIDER_SITE_OTHER): Payer: No Typology Code available for payment source

## 2019-08-02 DIAGNOSIS — Z3042 Encounter for surveillance of injectable contraceptive: Secondary | ICD-10-CM

## 2019-08-02 MED ORDER — MEDROXYPROGESTERONE ACETATE 150 MG/ML IM SUSP
150.0000 mg | Freq: Once | INTRAMUSCULAR | Status: AC
  Administered 2019-08-02: 150 mg via INTRAMUSCULAR

## 2019-09-29 HISTORY — PX: TRIGGER FINGER RELEASE: SHX641

## 2019-10-21 ENCOUNTER — Other Ambulatory Visit: Payer: Self-pay | Admitting: Obstetrics and Gynecology

## 2019-10-25 ENCOUNTER — Ambulatory Visit (INDEPENDENT_AMBULATORY_CARE_PROVIDER_SITE_OTHER): Payer: No Typology Code available for payment source

## 2019-10-25 ENCOUNTER — Other Ambulatory Visit: Payer: Self-pay

## 2019-10-25 DIAGNOSIS — Z3042 Encounter for surveillance of injectable contraceptive: Secondary | ICD-10-CM

## 2019-10-25 MED ORDER — MEDROXYPROGESTERONE ACETATE 150 MG/ML IM SUSP
150.0000 mg | Freq: Once | INTRAMUSCULAR | Status: AC
  Administered 2019-10-25: 150 mg via INTRAMUSCULAR

## 2019-10-25 NOTE — Progress Notes (Signed)
Patient presents today for Depo Provera injection within dates. Given IM RD Patient tolerated well. 

## 2019-12-27 ENCOUNTER — Other Ambulatory Visit: Payer: Self-pay

## 2019-12-27 ENCOUNTER — Encounter: Payer: Self-pay | Admitting: Obstetrics and Gynecology

## 2019-12-27 ENCOUNTER — Ambulatory Visit (INDEPENDENT_AMBULATORY_CARE_PROVIDER_SITE_OTHER): Payer: No Typology Code available for payment source | Admitting: Obstetrics and Gynecology

## 2019-12-27 ENCOUNTER — Other Ambulatory Visit (HOSPITAL_COMMUNITY)
Admission: RE | Admit: 2019-12-27 | Discharge: 2019-12-27 | Disposition: A | Discharge status: Home / Self Care | Payer: No Typology Code available for payment source | Source: Ambulatory Visit | Attending: Obstetrics and Gynecology | Admitting: Obstetrics and Gynecology

## 2019-12-27 VITALS — BP 132/82 | HR 77 | Ht 68.0 in | Wt 252.0 lb

## 2019-12-27 DIAGNOSIS — Z3042 Encounter for surveillance of injectable contraceptive: Secondary | ICD-10-CM | POA: Diagnosis not present

## 2019-12-27 DIAGNOSIS — Z01419 Encounter for gynecological examination (general) (routine) without abnormal findings: Secondary | ICD-10-CM

## 2019-12-27 DIAGNOSIS — Z124 Encounter for screening for malignant neoplasm of cervix: Secondary | ICD-10-CM | POA: Diagnosis not present

## 2019-12-27 DIAGNOSIS — Z1239 Encounter for other screening for malignant neoplasm of breast: Secondary | ICD-10-CM

## 2019-12-27 MED ORDER — MEDROXYPROGESTERONE ACETATE 150 MG/ML IM SUSP: INTRAMUSCULAR | 3 refills | Status: DC

## 2019-12-27 NOTE — Progress Notes (Signed)
Gynecology Annual Exam   PCP: Pcp, No  Chief Complaint:  Chief Complaint  Patient presents with  . Gynecologic Exam    History of Present Illness: Patient is a 39 y.o. G1P1001 presents for annual exam. The patient has no complaints today.   LMP: No LMP recorded. Patient has had an injection. Amenorhea on depo provera  The patient is sexually active. She currently uses Depo-Provera injections for contraception. She denies dyspareunia.  The patient does perform self breast exams.  There is no notable family history of breast or ovarian cancer in her family.  The patient wears seatbelts: yes.   The patient has regular exercise: not asked.    The patient denies current symptoms of depression.    Review of Systems: Review of Systems  Constitutional: Negative for chills and fever.  HENT: Negative for congestion.   Respiratory: Negative for cough and shortness of breath.   Cardiovascular: Negative for chest pain and palpitations.  Gastrointestinal: Negative for abdominal pain, constipation, diarrhea, heartburn, nausea and vomiting.  Genitourinary: Negative for dysuria, frequency and urgency.  Skin: Negative for itching and rash.  Neurological: Negative for dizziness and headaches.  Endo/Heme/Allergies: Negative for polydipsia.  Psychiatric/Behavioral: Negative for depression.    Past Medical History:  There are no problems to display for this patient.   Past Surgical History:  Past Surgical History:  Procedure Laterality Date  . CARPAL TUNNEL RELEASE Bilateral   . CERVICAL CONE BIOPSY    . CHOLECYSTECTOMY    . KNEE ARTHROSCOPY Left 06/07/2018   Procedure: ARTHROSCOPY KNEE WITH MEDIAL AND LATERAL MENISCAL REPAIR-LEFT;  Surgeon: Deeann Saint, MD;  Location: [facility] ORS;  Service: Orthopedics;  Laterality: Left;  . KNEE ARTHROSCOPY WITH MENISCAL REPAIR Left 08/10/2017   Procedure: KNEE ARTHROSCOPY WITH MENISCAL REPAIR;  Surgeon: Deeann Saint, MD;  Location: [facility] ORS;   Service: Orthopedics;  Laterality: Left;  . TRIGGER FINGER RELEASE  09/29/2019    Gynecologic History:  No LMP recorded. Patient has had an injection. Contraception: Depo-Provera injections Last Pap: Results were: Colposcopy 12/31/2017 Colposcopy CIN I Pap 11/24/2017 NIL and HR HPV+, HPV 16 positive  Obstetric History: G1P1001  Family History:  History reviewed. No pertinent family history.  Social History:  Social History   Socioeconomic History  . Marital status: Married    Spouse name: Not on file  . Number of children: Not on file  . Years of education: Not on file  . Highest education level: Not on file  Occupational History  . Not on file  Tobacco Use  . Smoking status: Never Smoker  . Smokeless tobacco: Never Used  Vaping Use  . Vaping Use: Never used  Substance and Sexual Activity  . Alcohol use: Never  . Drug use: Never  . Sexual activity: Yes    Birth control/protection: Injection  Other Topics Concern  . Not on file  Social History Narrative  . Not on file   Social Determinants of Health   Financial Resource Strain:   . Difficulty of Paying Living Expenses: Not on file  Food Insecurity:   . Worried About Programme researcher, broadcasting/film/video in the Last Year: Not on file  . Ran Out of Food in the Last Year: Not on file  Transportation Needs:   . Lack of Transportation (Medical): Not on file  . Lack of Transportation (Non-Medical): Not on file  Physical Activity:   . Days of Exercise per Week: Not on file  . Minutes of Exercise per Session:  Not on file  Stress:   . Feeling of Stress : Not on file  Social Connections:   . Frequency of Communication with Friends and Family: Not on file  . Frequency of Social Gatherings with Friends and Family: Not on file  . Attends Religious Services: Not on file  . Active Member of Clubs or Organizations: Not on file  . Attends Banker Meetings: Not on file  . Marital Status: Not on file  Intimate Partner Violence:     . Fear of Current or Ex-Partner: Not on file  . Emotionally Abused: Not on file  . Physically Abused: Not on file  . Sexually Abused: Not on file    Allergies:  Allergies  Allergen Reactions  . Fluconazole Itching, Rash and Other (See Comments)  . Penicillins Itching and Rash    Has patient had a PCN reaction causing immediate rash, facial/tongue/throat swelling, SOB or lightheadedness with hypotension: Yes Has patient had a PCN reaction causing severe rash involving mucus membranes or skin necrosis: No Has patient had a PCN reaction that required hospitalization: Yes Has patient had a PCN reaction occurring within the last 10 years: Yes If all of the above answers are "NO", then may proceed with Cephalosporin use.   . Sulfa Antibiotics Itching and Rash    Medications: Prior to Admission medications   Medication Sig Start Date End Date Taking? Authorizing Provider  acetaminophen-codeine (TYLENOL #3) 300-30 MG tablet Take 1 tablet by mouth daily as needed for moderate pain.   Yes [provider]  esomeprazole (NEXIUM) 20 MG capsule Take 20 mg by mouth daily at 12 noon.   Yes [provider]  gabapentin (NEURONTIN) 300 MG capsule gabapentin 300 mg capsule  TAKE (1) CAPSULE BY MOUTH THREE TIMES DAILY   Yes [provider]  medroxyPROGESTERone (DEPO-PROVERA) 150 MG/ML injection INJECT I.M. EVERY 3 MONTHS. 10/21/19  Yes Vena Austria, MD  meloxicam (MOBIC) 15 MG tablet Take 1 tablet (15 mg total) by mouth daily. 06/07/18  Yes Deeann Saint, MD  Probiotic CAPS Take 1 capsule by mouth daily at 12 noon.   Yes [provider]    Physical Exam Vitals: Blood pressure 132/82, pulse 77, height 5\' 8"  (1.727 m), weight 252 lb (114.3 kg).  General: NAD HEENT: normocephalic, anicteric Thyroid: no enlargement, no palpable nodules Pulmonary: No increased work of breathing, CTAB Cardiovascular: RRR, distal pulses 2+ Breast: Breast symmetrical, no  tenderness, no palpable nodules or masses, no skin or nipple retraction present, no nipple discharge.  No axillary or supraclavicular lymphadenopathy. Abdomen: NABS, soft, non-tender, non-distended.  Umbilicus without lesions.  No hepatomegaly, splenomegaly or masses palpable. No evidence of hernia  Genitourinary:  External: Normal external female genitalia.  Normal urethral meatus, normal Bartholin's and Skene's glands.    Vagina: Normal vaginal mucosa, no evidence of prolapse.    Cervix: Grossly normal in appearance, no bleeding  Uterus: Non-enlarged, mobile, normal contour.  No CMT  Adnexa: ovaries non-enlarged, no adnexal masses  Rectal: deferred  Lymphatic: no evidence of inguinal lymphadenopathy Extremities: no edema, erythema, or tenderness Neurologic: Grossly intact Psychiatric: mood appropriate, affect full  Female chaperone present for pelvic and breast  portions of the physical exam    Assessment: 39 y.o. G1P1001 routine annual exam  Plan: Problem List Items Addressed This Visit    None    Visit Diagnoses    Encounter for gynecological examination without abnormal finding    -  Primary   Screening for malignant  neoplasm of cervix       Relevant Orders   Cytology - PAP   Breast screening       Encounter for surveillance of injectable contraceptive          2) STI screening  was notoffered and therefore not obtained  2)  ASCCP guidelines and rational discussed.  Repeat for CIN I today 2019  3) Contraception - the patient is currently using  Depo-Provera injections.  She is happy with her current form of contraception and plans to continue  4) Routine healthcare maintenance including cholesterol, diabetes screening discussed managed by PCP  5) Return in about 1 year (around 12/26/2020) for annual (depo every 3 months).   Vena Austria, MD, [facility], [facility] 12/27/2019, 8:58 AM

## 2020-01-02 LAB — CYTOLOGY - PAP
Comment: NEGATIVE
Comment: NEGATIVE
Diagnosis: NEGATIVE
HPV 16: POSITIVE — AB
HPV 18 / 45: NEGATIVE
High risk HPV: POSITIVE — AB

## 2020-01-11 ENCOUNTER — Telehealth: Payer: Self-pay | Admitting: Obstetrics and Gynecology

## 2020-01-11 DIAGNOSIS — S86919A Strain of unspecified muscle(s) and tendon(s) at lower leg level, unspecified leg, initial encounter: Secondary | ICD-10-CM | POA: Insufficient documentation

## 2020-01-11 NOTE — Telephone Encounter (Signed)
Patient scheduled.

## 2020-01-11 NOTE — Telephone Encounter (Signed)
Called and left voicemail for patient to call back to be scheduled. 

## 2020-01-11 NOTE — Telephone Encounter (Signed)
-----   Message from Vena Austria, MD sent at 01/11/2020 11:47 AM EDT ----- Needs colposcopy next 1-4 weeks

## 2020-01-11 NOTE — Progress Notes (Signed)
Needs colposcopy next 1-4 weeks

## 2020-01-17 ENCOUNTER — Other Ambulatory Visit: Payer: Self-pay

## 2020-01-17 ENCOUNTER — Ambulatory Visit (INDEPENDENT_AMBULATORY_CARE_PROVIDER_SITE_OTHER): Payer: No Typology Code available for payment source

## 2020-01-17 DIAGNOSIS — Z3042 Encounter for surveillance of injectable contraceptive: Secondary | ICD-10-CM

## 2020-01-17 MED ORDER — MEDROXYPROGESTERONE ACETATE 150 MG/ML IM SUSP
150.0000 mg | Freq: Once | INTRAMUSCULAR | Status: AC
  Administered 2020-01-17: 150 mg via INTRAMUSCULAR

## 2020-01-17 NOTE — Progress Notes (Signed)
Pt here for depo which was given IM right deltoid.  NDC# 2263-3354-56

## 2020-02-01 ENCOUNTER — Other Ambulatory Visit (HOSPITAL_COMMUNITY)
Admission: RE | Admit: 2020-02-01 | Discharge: 2020-02-01 | Disposition: A | Discharge status: Home / Self Care | Payer: PRIVATE HEALTH INSURANCE | Source: Ambulatory Visit | Attending: Advanced Practice Midwife | Admitting: Advanced Practice Midwife

## 2020-02-01 ENCOUNTER — Encounter: Payer: Self-pay | Admitting: Advanced Practice Midwife

## 2020-02-01 ENCOUNTER — Other Ambulatory Visit: Payer: Self-pay

## 2020-02-01 ENCOUNTER — Ambulatory Visit (INDEPENDENT_AMBULATORY_CARE_PROVIDER_SITE_OTHER): Payer: No Typology Code available for payment source | Admitting: Advanced Practice Midwife

## 2020-02-01 VITALS — BP 122/74 | Ht 68.0 in | Wt 253.0 lb

## 2020-02-01 DIAGNOSIS — R300 Dysuria: Secondary | ICD-10-CM | POA: Diagnosis not present

## 2020-02-01 DIAGNOSIS — R102 Pelvic and perineal pain: Secondary | ICD-10-CM | POA: Insufficient documentation

## 2020-02-01 DIAGNOSIS — E2839 Other primary ovarian failure: Secondary | ICD-10-CM | POA: Diagnosis not present

## 2020-02-01 MED ORDER — ESTRADIOL 0.5 MG PO TABS: 0.5000 mg | ORAL_TABLET | Freq: Every day | ORAL | 1 refills | Status: DC

## 2020-02-01 NOTE — Progress Notes (Signed)
Patient ID: Allison Mooney, female   DOB: [birth year], 39 y.o.   MRN: [id number]  Reason for Consult: Follow-up, Menstrual Problem, and Pelvic Pain    Subjective:  HPI:  Allison Mooney is a 39 y.o. female being seen for several issues. She had her most recent Depo shot about a week ago and at the same time began having spotting when wiping. She is concerned as normally she doesn't have any bleeding. She also has some back pain and abdominal cramping that is reminiscent of period cramps. She has been on Depo for most of the past 15 years. We discussed that she may have a hormonal imbalance and it might help to add a source of estrogen for a short time. She may also want to reconsider continuing on Depo due to the length of time she has been on it. She would like to discuss that with Dr Bonney Aid who also treats her PCOS. She also mentions some burning and pelvic pressure with urination and is concerned for possible UTI or vaginitis. She does not have concerns for STDs. We will do labs today.   Past Medical History:  Diagnosis Date  . Arthritis   . Family history of adverse reaction to anesthesia    MOM-DISORIENTED  . GERD (gastroesophageal reflux disease)    No family history on file. Past Surgical History:  Procedure Laterality Date  . CARPAL TUNNEL RELEASE Bilateral   . CERVICAL CONE BIOPSY    . CHOLECYSTECTOMY    . KNEE ARTHROSCOPY Left 06/07/2018   Procedure: ARTHROSCOPY KNEE WITH MEDIAL AND LATERAL MENISCAL REPAIR-LEFT;  Surgeon: Deeann Saint, MD;  Location: [facility] OR[facility];  Service: Orthopedics;  Laterality: Left;  . KNEE ARTHROSCOPY WITH MENISCAL REPAIR Left 08/10/2017   Procedure: KNEE ARTHROSCOPY WITH MENISCAL REPAIR;  Surgeon: Deeann Saint, MD;  Location: [facility] ORS;  Service: Orthopedics;  Laterality: Left;  . TRIGGER FINGER RELEASE  09/29/2019    Short Social History:  Social History   Tobacco Use  . Smoking status: Never Smoker  . Smokeless tobacco: Never Used  Substance Use  Topics  . Alcohol use: Never    Allergies  Allergen Reactions  . Fluconazole Itching, Rash and Other (See Comments)  . Penicillins Itching and Rash    Has patient had a PCN reaction causing immediate rash, facial/tongue/throat swelling, SOB or lightheadedness with hypotension: Yes Has patient had a PCN reaction causing severe rash involving mucus membranes or skin necrosis: No Has patient had a PCN reaction that required hospitalization: Yes Has patient had a PCN reaction occurring within the last 10 years: Yes If all of the above answers are "NO", then may proceed with Cephalosporin use.   . Sulfa Antibiotics Itching and Rash    Current Outpatient Medications  Medication Sig Dispense Refill  . acetaminophen-codeine (TYLENOL #3) 300-30 MG tablet Take 1 tablet by mouth daily as needed for moderate pain.    . cyclobenzaprine (FLEXERIL) 10 MG tablet Take 10 mg by mouth at bedtime.    Marland Kitchen esomeprazole (NEXIUM) 20 MG capsule Take 20 mg by mouth daily at 12 noon.    Marland Kitchen estradiol (ESTRACE) 0.5 MG tablet Take 1 tablet (0.5 mg total) by mouth daily. 30 tablet 1  . gabapentin (NEURONTIN) 300 MG capsule gabapentin 300 mg capsule  TAKE (1) CAPSULE BY MOUTH THREE TIMES DAILY    . medroxyPROGESTERone (DEPO-PROVERA) 150 MG/ML injection INJECT I.M. EVERY 3 MONTHS. 1 mL 3  . meloxicam (MOBIC) 15 MG tablet Take 1 tablet (15  mg total) by mouth daily. 30 tablet 3  . Probiotic CAPS Take 1 capsule by mouth daily at 12 noon.     No current facility-administered medications for this visit.    Review of Systems  Constitutional: Positive for malaise/fatigue. Negative for chills and fever.  HENT: Negative for congestion, ear discharge, ear pain, hearing loss, sinus pain and sore throat.   Eyes: Negative for blurred vision and double vision.  Respiratory: Negative for cough, shortness of breath and wheezing.   Cardiovascular: Negative for chest pain, palpitations and leg swelling.  Gastrointestinal:  Negative for abdominal pain, blood in stool, constipation, diarrhea, heartburn, melena, nausea and vomiting.  Genitourinary: Positive for dysuria and frequency. Negative for flank pain, hematuria and urgency.  Musculoskeletal: Positive for joint pain. Negative for back pain and myalgias.  Skin: Negative for itching and rash.  Neurological: Negative for dizziness, tingling, tremors, sensory change, speech change, focal weakness, seizures, loss of consciousness, weakness and headaches.  Endo/Heme/Allergies: Negative for environmental allergies. Does not bruise/bleed easily.       Vaginal spotting when wiping  Psychiatric/Behavioral: Negative for depression, hallucinations, memory loss, substance abuse and suicidal ideas. The patient is not nervous/anxious and does not have insomnia.         Objective:  Objective   Vitals:   02/01/20 0928  BP: 122/74  Weight: 253 lb (114.8 kg)  Height: 5\' 8"  (1.727 m)   Body mass index is 38.47 kg/m. Constitutional: Well nourished, well developed female in no acute distress.  HEENT: normal Skin: Warm and dry.  Respiratory:  Normal respiratory effort Back: no CVAT Neuro: DTRs 2+, Cranial nerves grossly intact Psych: Alert and Oriented x3. No memory deficits. Normal mood and affect.  MS: normal gait, normal bilateral lower extremity ROM/strength/stability.  Pelvic exam:  is not limited by body habitus EGBUS: within normal limits Vagina: within normal limits and with normal mucosa blood in the vault Cervix: not evaluated   Assessment/Plan:     39 y.o. G1 P1 female with estrogen deficiency  Rx Estrace 0.5 mg short course Follow up with Dr 24 for colposcopy and discuss further treatment with him as needed Urine Culture Aptima: vaginitis Follow up as needed after labs result   Bonney Aid CNM [facility] 02/01/2020, 4:19 PM

## 2020-02-03 LAB — URINE CULTURE

## 2020-02-03 LAB — CERVICOVAGINAL ANCILLARY ONLY
Bacterial Vaginitis (gardnerella): POSITIVE — AB
Candida Glabrata: NEGATIVE
Candida Vaginitis: NEGATIVE
Comment: NEGATIVE
Comment: NEGATIVE
Comment: NEGATIVE

## 2020-02-06 ENCOUNTER — Other Ambulatory Visit: Payer: Self-pay | Admitting: Obstetrics & Gynecology

## 2020-02-06 ENCOUNTER — Other Ambulatory Visit: Payer: Self-pay | Admitting: Advanced Practice Midwife

## 2020-02-06 MED ORDER — METRONIDAZOLE 0.75 % VA GEL: 1.0000 | Freq: Every day | VAGINAL | 0 refills | Status: AC

## 2020-02-06 MED ORDER — FLUCONAZOLE 150 MG PO TABS: 150.0000 mg | ORAL_TABLET | Freq: Once | ORAL | 3 refills | Status: AC

## 2020-02-06 NOTE — Telephone Encounter (Signed)
Pt also called; wants antibx for BV and diflucan b/c the antibx always gives her a YI.

## 2020-02-06 NOTE — Telephone Encounter (Signed)
Can you send in RX? Labs positive for BV, Erskine Squibb is not in the office today.

## 2020-02-20 ENCOUNTER — Other Ambulatory Visit: Payer: Self-pay | Admitting: Obstetrics and Gynecology

## 2020-02-20 MED ORDER — TERCONAZOLE 0.4 % VA CREA
1.0000 | TOPICAL_CREAM | Freq: Every day | VAGINAL | 1 refills | Status: DC
Start: 2020-02-20 — End: 2020-04-17

## 2020-02-20 MED ORDER — METRONIDAZOLE 500 MG PO TABS: 500.0000 mg | ORAL_TABLET | Freq: Two times a day (BID) | ORAL | 0 refills | Status: AC

## 2020-02-21 ENCOUNTER — Encounter: Payer: Self-pay | Admitting: Obstetrics and Gynecology

## 2020-02-21 ENCOUNTER — Other Ambulatory Visit (HOSPITAL_COMMUNITY)
Admission: RE | Admit: 2020-02-21 | Discharge: 2020-02-21 | Disposition: A | Discharge status: Home / Self Care | Payer: PRIVATE HEALTH INSURANCE | Source: Ambulatory Visit | Attending: Obstetrics and Gynecology | Admitting: Obstetrics and Gynecology

## 2020-02-21 ENCOUNTER — Other Ambulatory Visit: Payer: Self-pay

## 2020-02-21 ENCOUNTER — Ambulatory Visit (INDEPENDENT_AMBULATORY_CARE_PROVIDER_SITE_OTHER): Payer: No Typology Code available for payment source | Admitting: Obstetrics and Gynecology

## 2020-02-21 VITALS — BP 138/82 | Ht 68.0 in | Wt 257.0 lb

## 2020-02-21 DIAGNOSIS — R102 Pelvic and perineal pain: Secondary | ICD-10-CM

## 2020-02-21 DIAGNOSIS — N72 Inflammatory disease of cervix uteri: Secondary | ICD-10-CM | POA: Insufficient documentation

## 2020-02-21 DIAGNOSIS — B977 Papillomavirus as the cause of diseases classified elsewhere: Secondary | ICD-10-CM

## 2020-02-21 NOTE — Progress Notes (Signed)
   GYNECOLOGY CLINIC COLPOSCOPY PROCEDURE NOTE  39 y.o. 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 Discussed underlying role for HPV infection in the development of cervical dysplasia, its natural history and progression/regression, need for surveillance.  She does report some contact bleeding recently treated for BV metrogel no improvement so now switch to oral flagyl.  If no improvement in contact bleeding will proceed with trial of doxycline.  Also left flank/pelvic pain ultrasound ordered.       Is the patient  pregnant: No LMP: No LMP recorded. Patient has had an injection. Smoking status:  reports that she has never smoked. She has never used smokeless tobacco. Contraception: diaphragm Number current sexual partners:  1 High risk partner:No History of STD:  No Future fertility desired:  No  Patient given informed consent, signed copy in the chart, time out was performed.  The patient was position in dorsal lithotomy position. Speculum was placed the cervix was visualized.   After application of acetic acid colposcopic inspection of the cervix was undertaken.   Colposcopy adequate, full visualization of transformation zone: Yes Cervix increased friability 5 and 6 O'Clock; corresponding biopsies obtained.   ECC specimen obtained:  Yes  All specimens were labeled and sent to pathology.   Patient was given post procedure instructions.  Will follow up pathology and manage accordingly.  Routine preventative health maintenance measures emphasized. TVUS ordered for pelvic pain.    OBGyn Exam  Vena Austria, MD, [facility], [facility]

## 2020-02-21 NOTE — Progress Notes (Signed)
Colposcopy

## 2020-02-22 LAB — SURGICAL PATHOLOGY

## 2020-02-24 ENCOUNTER — Telehealth: Payer: Self-pay | Admitting: Obstetrics and Gynecology

## 2020-02-24 ENCOUNTER — Other Ambulatory Visit: Payer: Self-pay | Admitting: Obstetrics and Gynecology

## 2020-02-24 MED ORDER — DOXYCYCLINE HYCLATE 100 MG PO CAPS
100.0000 mg | ORAL_CAPSULE | Freq: Two times a day (BID) | ORAL | 0 refills | Status: AC
Start: 2020-02-24 — End: 2020-03-02

## 2020-02-24 NOTE — Telephone Encounter (Signed)
Patient calling to schedule ultrasound. Pt states hospital hasn't contacted her to schedule.

## 2020-02-24 NOTE — Progress Notes (Signed)
Cervicitis noted on biopsy.  Repeat pap 12 months.  Rx doxycyline for cervicitis.

## 2020-02-24 NOTE — Telephone Encounter (Signed)
Patient calling about colpo results.

## 2020-02-29 ENCOUNTER — Ambulatory Visit: Payer: No Typology Code available for payment source

## 2020-03-21 ENCOUNTER — Other Ambulatory Visit: Payer: Self-pay | Admitting: Advanced Practice Midwife

## 2020-03-21 DIAGNOSIS — B3731 Acute candidiasis of vulva and vagina: Secondary | ICD-10-CM

## 2020-03-21 MED ORDER — FLUCONAZOLE 150 MG PO TABS: 150.0000 mg | ORAL_TABLET | Freq: Once | ORAL | 1 refills | Status: AC

## 2020-03-21 NOTE — Progress Notes (Signed)
Diflucan sent per patient request to treat yeast symptoms.

## 2020-04-10 ENCOUNTER — Ambulatory Visit (INDEPENDENT_AMBULATORY_CARE_PROVIDER_SITE_OTHER): Payer: 59

## 2020-04-10 ENCOUNTER — Other Ambulatory Visit: Payer: Self-pay

## 2020-04-10 DIAGNOSIS — Z3042 Encounter for surveillance of injectable contraceptive: Secondary | ICD-10-CM | POA: Diagnosis not present

## 2020-04-10 MED ORDER — MEDROXYPROGESTERONE ACETATE 150 MG/ML IM SUSP
150.0000 mg | Freq: Once | INTRAMUSCULAR | Status: AC
  Administered 2020-04-10: 150 mg via INTRAMUSCULAR

## 2020-04-10 NOTE — Progress Notes (Signed)
Patient presents today for Depo Provera injection within dates. Given IM Left Deltoid per patient request. Patient tolerated well. 

## 2020-04-17 ENCOUNTER — Other Ambulatory Visit (HOSPITAL_COMMUNITY)
Admission: RE | Admit: 2020-04-17 | Discharge: 2020-04-17 | Disposition: A | Discharge status: Home / Self Care | Payer: No Typology Code available for payment source | Source: Ambulatory Visit | Attending: Obstetrics & Gynecology | Admitting: Obstetrics & Gynecology

## 2020-04-17 ENCOUNTER — Other Ambulatory Visit: Payer: Self-pay

## 2020-04-17 ENCOUNTER — Ambulatory Visit (INDEPENDENT_AMBULATORY_CARE_PROVIDER_SITE_OTHER): Payer: 59 | Admitting: Obstetrics & Gynecology

## 2020-04-17 ENCOUNTER — Encounter: Payer: Self-pay | Admitting: Obstetrics & Gynecology

## 2020-04-17 DIAGNOSIS — N898 Other specified noninflammatory disorders of vagina: Secondary | ICD-10-CM | POA: Insufficient documentation

## 2020-04-17 MED ORDER — METRONIDAZOLE 500 MG PO TABS: 500.0000 mg | ORAL_TABLET | Freq: Two times a day (BID) | ORAL | 0 refills | Status: DC

## 2020-04-17 NOTE — Patient Instructions (Signed)
Metronidazole tablets or capsules What is this medicine? METRONIDAZOLE (me troe NI da zole) is an antiinfective. It is used to treat certain kinds of bacterial and protozoal infections. It will not work for colds, flu, or other viral infections. This medicine may be used for other purposes; ask your health care provider or pharmacist if you have questions. COMMON BRAND NAME(S): Flagyl What should I tell my health care provider before I take this medicine? They need to know if you have any of these conditions:  Cockayne syndrome  history of blood diseases such as sickle cell anemia, anemia, or leukemia  if you often drink alcohol  irregular heartbeat or rhythm  kidney disease  liver disease  yeast or fungal infection  an unusual or allergic reaction to metronidazole, nitroimidazoles, or other medicines, foods, dyes, or preservatives  pregnant or trying to get pregnant  breast-feeding How should I use this medicine? Take this medicine by mouth with water. Take it as directed on the prescription label at the same time every day. Take all of this medicine unless your health care provider tells you to stop it early. Keep taking it even if you think you are better. Talk to your health care provider about the use of this medicine in children. While it may be prescribed for children for selected conditions, precautions do apply. Overdosage: If you think you have taken too much of this medicine contact a poison control center or emergency room at once. NOTE: This medicine is only for you. Do not share this medicine with others. What if I miss a dose? If you miss a dose, take it as soon as you can. If it is almost time for your next dose, take only that dose. Do not take double or extra doses. What may interact with this medicine? Do not take this medicine with any of the following medications:  alcohol or any product that contains  alcohol  cisapride  disulfiram  dronedarone  pimozide  thioridazine This medicine may also interact with the following medications:  birth control pills  busulfan  carbamazepine  certain medicines that treat or prevent blood clots like warfarin  cimetidine  lithium  other medicines that prolong the QT interval (cause an abnormal heart rhythm)  phenobarbital  phenytoin This list may not describe all possible interactions. Give your health care provider a list of all the medicines, herbs, non-prescription drugs, or dietary supplements you use. Also tell them if you smoke, drink alcohol, or use illegal drugs. Some items may interact with your medicine. What should I watch for while using this medicine? Tell your health care provider if your symptoms do not start to get better or if they get worse. Some products may contain alcohol. Ask your health care provider if this medicine contains alcohol. Be sure to tell all health care providers you are taking this medicine. Certain medicines, such as metronidazole and disulfiram, can cause an unpleasant reaction when taken with alcohol. The reaction includes flushing, headache, nausea, vomiting, sweating, and increased thirst. The reaction can last from 30 minutes to several hours. If you are being treated for a sexually transmitted disease (STD), avoid sexual contact until you have finished your treatment. Your sexual partner may also need treatment. Birth control may not work properly while you are taking this medicine. Talk to your health care provider about using an extra method of birth control. What side effects may I notice from receiving this medicine? Side effects that you should report to your doctor  or health care professional as soon as possible:  allergic reactions like skin rash, itching or hives, swelling of the face, lips, or tongue  confusion  fast, irregular heartbeat  light-colored stool  liver injury (dark  yellow or brown urine; general ill feeling or flu-like symptoms; loss of appetite, right upper belly pain; unusually weak or tired, yellowing of the eyes or skin)  pain, tingling, numbness in the hands or feet  rash, fever, and swollen lymph nodes  redness, blistering, peeling or loosening of the skin, including inside the mouth  seizures  unusual vaginal discharge, itching, or odor Side effects that usually do not require medical attention (report to your doctor or health care professional if they continue or are bothersome):  change in taste  diarrhea  headache  nausea  stomach pain  vomiting This list may not describe all possible side effects. Call your doctor for medical advice about side effects. You may report side effects to FDA at 1-800-FDA-1088. Where should I keep my medicine? Keep out of the reach of children and pets. Store between 15 and 25 degrees C (59 and 77 degrees F). Protect from light. Get rid of any unused medicine after the expiration date. To get rid of medicines that are no longer needed or have expired:  Take the medicine to a medicine take-back program. Check with your pharmacy or law enforcement to find a location.  If you cannot return the medicine, check the label or package insert to see if the medicine should be thrown out in the garbage or flushed down the toilet. If you are not sure, ask your health care provider. If it is safe to put it in the trash, take the medicine out of the container. Mix the medicine with cat litter, dirt, coffee grounds, or other unwanted substance. Seal the mixture in a bag or container. Put it in the trash. NOTE: This sheet is a summary. It may not cover all possible information. If you have questions about this medicine, talk to your doctor, pharmacist, or health care provider.  2021 Elsevier/Gold Standard (2019-06-28 09:24:11)

## 2020-04-17 NOTE — Progress Notes (Signed)
HPI:      [name] is a 40 y.o. G1P1001 who LMP was No LMP recorded. Patient has had an injection., presents today for a problem visit.  She complains of:  Vaginitis: Patient complains of an abnormal vaginal discharge for 2 weeks. Vaginal symptoms include discharge described as white, odorless and frothy and local irritation.Vulvar symptoms include none.STI Risk: Very low risk of STD exposure.  Discharge described as: white, frothy and odorless.Other associated symptoms: painwhich is lower pelvic; separate form the timing of vag d/c.  She was dx w BV in Sept, treated w Flagyl w success.  She has been off of her Probiotics. Poor tolerence to Diflucan and Metrogel in past.  Menstrual pattern: She had been bleeding rarely on DEPO. Contraception: Depo-Provera injections  PMHx: She  has a past medical history of Arthritis, Family history of adverse reaction to anesthesia, and GERD (gastroesophageal reflux disease). Also,  has a past surgical history that includes Carpal tunnel release (Bilateral); Cholecystectomy; Cervical cone biopsy; Knee arthroscopy with meniscal repair (Left, 08/10/2017); Knee arthroscopy (Left, 06/07/2018); and Trigger finger release (09/29/2019)., family history is not on file.,  reports that she has never smoked. She has never used smokeless tobacco. She reports that she does not drink alcohol and does not use drugs.  She has a current medication list which includes the following prescription(s): esomeprazole, gabapentin, medroxyprogesterone, meloxicam, metronidazole, and probiotic. Also, is allergic to fluconazole, penicillins, and sulfa antibiotics.  Review of Systems  Constitutional: Positive for malaise/fatigue. Negative for chills and fever.  HENT: Negative for congestion, sinus pain and sore throat.   Eyes: Negative for blurred vision and pain.  Respiratory: Negative for cough and wheezing.   Cardiovascular: Negative for chest pain and leg swelling.   Gastrointestinal: Negative for abdominal pain, constipation, diarrhea, heartburn, nausea and vomiting.  Genitourinary: Positive for frequency. Negative for dysuria, hematuria and urgency.  Musculoskeletal: Positive for joint pain. Negative for back pain, myalgias and neck pain.  Skin: Negative for itching and rash.  Neurological: Negative for dizziness, tremors and weakness.  Endo/Heme/Allergies: Does not bruise/bleed easily.  Psychiatric/Behavioral: Negative for depression. The patient is not nervous/anxious and does not have insomnia.     Objective: BP 124/80   Ht 5\' 8"  (1.727 m)   Wt 253 lb (114.8 kg)   BMI 38.47 kg/m  Physical Exam Constitutional:      General: She is not in acute distress.    Appearance: She is well-developed.  Genitourinary:     Vagina and uterus normal.     No vaginal erythema or bleeding.      Right Adnexa: not tender and no mass present.    Left Adnexa: not tender and no mass present.    No cervical motion tenderness, discharge, polyp or nabothian cyst.     Uterus is mobile.     Uterus is not enlarged.     No uterine mass detected.    Uterus is midaxial.     Pelvic exam was performed with patient supine.  HENT:     Head: Normocephalic and atraumatic.     Nose: Nose normal.  Abdominal:     General: There is no distension.     Palpations: Abdomen is soft.     Tenderness: There is no abdominal tenderness.  Musculoskeletal:        General: Normal range of motion.  Neurological:     Mental Status: She is alert and oriented to person, place, and time.  Cranial Nerves: No cranial nerve deficit.  Skin:    General: Skin is warm and dry.  Psychiatric:        Attention and Perception: Attention normal.        Mood and Affect: Mood and affect normal.        Speech: Speech normal.        Behavior: Behavior normal.        Thought Content: Thought content normal.        Judgment: Judgment normal.   Microscopic wet-mount exam shows negative for  pathogens, normal epithelial cells, clue cells mildly present.  ASSESSMENT/PLAN:    Problem List Items Addressed This Visit   BV likely dx Cultures to confirm Tx as she has had this in past, w Flagyl   Visit Diagnoses    Vaginal discharge       Relevant Orders   Cervicovaginal ancillary only    Pain, normal exam, unlikely cysts    Cont Depo    [place] if persists despite tx of above  Annamarie Major, MD, [facility], [facility] 04/17/2020  2:36 PM

## 2020-04-19 LAB — CERVICOVAGINAL ANCILLARY ONLY
Bacterial Vaginitis (gardnerella): POSITIVE — AB
Candida Glabrata: NEGATIVE
Candida Vaginitis: NEGATIVE
Comment: NEGATIVE
Comment: NEGATIVE
Comment: NEGATIVE
Comment: NEGATIVE
Trichomonas: POSITIVE — AB

## 2020-04-20 ENCOUNTER — Other Ambulatory Visit: Payer: Self-pay | Admitting: Obstetrics & Gynecology

## 2020-04-20 MED ORDER — METRONIDAZOLE 500 MG PO TABS: 2000.0000 mg | ORAL_TABLET | Freq: Once | ORAL | 1 refills | Status: AC

## 2020-04-20 NOTE — Progress Notes (Signed)
Sch f/u appt 2-3 weeks for exam  D/W pt dx and tx of BV, Trich, need for f/u

## 2020-04-24 ENCOUNTER — Other Ambulatory Visit: Payer: Self-pay | Admitting: Obstetrics & Gynecology

## 2020-05-04 ENCOUNTER — Other Ambulatory Visit: Payer: Self-pay

## 2020-05-04 ENCOUNTER — Ambulatory Visit (INDEPENDENT_AMBULATORY_CARE_PROVIDER_SITE_OTHER): Payer: No Typology Code available for payment source | Admitting: Obstetrics & Gynecology

## 2020-05-04 ENCOUNTER — Encounter: Payer: Self-pay | Admitting: Obstetrics & Gynecology

## 2020-05-04 ENCOUNTER — Other Ambulatory Visit (HOSPITAL_COMMUNITY)
Admission: RE | Admit: 2020-05-04 | Discharge: 2020-05-04 | Disposition: A | Discharge status: Home / Self Care | Payer: No Typology Code available for payment source | Source: Ambulatory Visit | Attending: Obstetrics & Gynecology | Admitting: Obstetrics & Gynecology

## 2020-05-04 VITALS — BP 120/80 | Ht 68.0 in | Wt 257.0 lb

## 2020-05-04 DIAGNOSIS — N898 Other specified noninflammatory disorders of vagina: Secondary | ICD-10-CM

## 2020-05-04 NOTE — Progress Notes (Signed)
History of Present Illness:  Allison Mooney is a 40 y.o. who recently underwent Tx for BV and Trich as dx by culture after having vag discharge.  She has taken Flagyl and present for test of cure.  No recent sx's.    PMHx: She  has a past medical history of Arthritis, Family history of adverse reaction to anesthesia, and GERD (gastroesophageal reflux disease). Also,  has a past surgical history that includes Carpal tunnel release (Bilateral); Cholecystectomy; Cervical cone biopsy; Knee arthroscopy with meniscal repair (Left, 08/10/2017); Knee arthroscopy (Left, 06/07/2018); and Trigger finger release (09/29/2019)., family history is not on file.,  reports that she has never smoked. She has never used smokeless tobacco. She reports that she does not drink alcohol and does not use drugs. Current Meds  Medication Sig  . esomeprazole (NEXIUM) 20 MG capsule Take 20 mg by mouth daily at 12 noon.  . gabapentin (NEURONTIN) 300 MG capsule gabapentin 300 mg capsule  TAKE (1) CAPSULE BY MOUTH THREE TIMES DAILY  . medroxyPROGESTERone (DEPO-PROVERA) 150 MG/ML injection INJECT I.M. EVERY 3 MONTHS.  Marland Kitchen meloxicam (MOBIC) 15 MG tablet Take 1 tablet (15 mg total) by mouth daily.  . Probiotic CAPS Take 1 capsule by mouth daily at 12 noon.  . Also, is allergic to fluconazole, penicillins, and sulfa antibiotics..  Review of Systems  All other systems reviewed and are negative.  Physical Exam:  BP 120/80   Ht 5\' 8"  (1.727 m)   Wt 257 lb (116.6 kg)   BMI 39.08 kg/m  Body mass index is 39.08 kg/m. Physical Exam Constitutional:      General: She is not in acute distress.    Appearance: She is well-developed.  Genitourinary:     Vagina and uterus normal.     No vaginal erythema or bleeding.      Right Adnexa: not tender and no mass present.    Left Adnexa: not tender and no mass present.    No cervical motion tenderness, discharge, polyp or nabothian cyst.     Uterus is mobile.     Uterus is not  enlarged.     No uterine mass detected.    Uterus is midaxial.     Pelvic exam was performed with patient supine.  HENT:     Head: Normocephalic and atraumatic.     Nose: Nose normal.  Abdominal:     General: There is no distension.     Palpations: Abdomen is soft.     Tenderness: There is no abdominal tenderness.  Musculoskeletal:        General: Normal range of motion.  Neurological:     Mental Status: She is alert and oriented to person, place, and time.     Cranial Nerves: No cranial nerve deficit.  Skin:    General: Skin is warm and dry.  Psychiatric:        Attention and Perception: Attention normal.        Mood and Affect: Mood and affect normal.        Speech: Speech normal.        Behavior: Behavior normal.        Thought Content: Thought content normal.        Judgment: Judgment normal.     Assessment:  Problem List Items Addressed This Visit    Visit Diagnoses    Vaginal discharge    Prior Trich and BV TOC today   Relevant Orders   Cervicovaginal ancillary only  A total of 20 minutes were spent face-to-face with the patient as well as preparation, review, communication, and documentation during this encounter.    Annamarie Major, MD, [facility], [facility] 05/04/2020  11:03 AM

## 2020-05-07 LAB — CERVICOVAGINAL ANCILLARY ONLY
Bacterial Vaginitis (gardnerella): NEGATIVE
Candida Glabrata: NEGATIVE
Candida Vaginitis: NEGATIVE
Chlamydia: NEGATIVE
Comment: NEGATIVE
Comment: NEGATIVE
Comment: NEGATIVE
Comment: NEGATIVE
Comment: NEGATIVE
Comment: NORMAL
Neisseria Gonorrhea: NEGATIVE
Trichomonas: NEGATIVE

## 2020-05-17 ENCOUNTER — Emergency Department
Admission: EM | Admit: 2020-05-17 | Discharge: 2020-05-17 | Disposition: A | Discharge status: Home / Self Care | Payer: No Typology Code available for payment source | Attending: Emergency Medicine | Admitting: Emergency Medicine

## 2020-05-17 ENCOUNTER — Other Ambulatory Visit: Payer: Self-pay

## 2020-05-17 ENCOUNTER — Emergency Department: Payer: No Typology Code available for payment source

## 2020-05-17 DIAGNOSIS — W1839XA Other fall on same level, initial encounter: Secondary | ICD-10-CM | POA: Insufficient documentation

## 2020-05-17 DIAGNOSIS — Y990 Civilian activity done for income or pay: Secondary | ICD-10-CM | POA: Insufficient documentation

## 2020-05-17 DIAGNOSIS — M79672 Pain in left foot: Secondary | ICD-10-CM | POA: Insufficient documentation

## 2020-05-17 DIAGNOSIS — M25562 Pain in left knee: Secondary | ICD-10-CM | POA: Insufficient documentation

## 2020-05-17 MED ORDER — HYDROCODONE-ACETAMINOPHEN 5-325 MG PO TABS
1.0000 | ORAL_TABLET | Freq: Four times a day (QID) | ORAL | 0 refills | Status: AC | PRN
Start: 2020-05-17 — End: 2021-05-17

## 2020-05-17 NOTE — ED Provider Notes (Signed)
[facility] Emergency Department Provider Note   ____________________________________________   Event Date/Time   First MD Initiated Contact with Patient 05/17/20 1956     (approximate)  I have reviewed the triage vital signs and the nursing notes.   HISTORY  Chief Complaint Foot Injury    HPI Allison Mooney is a 40 y.o. female with a stated past medical history of esophageal reflux disorder as well as 2 previous left knee surgeries for a fracture and torn meniscus who presents after a fall at work in which she states that she slid on her leg across the floor.  Patient now complains of pain over the dorsum of the left foot as well and has worsened pain over her left knee.  Patient reports worsening pain in both spots with walking and or movement at these joints as well as palpation.  Patient denies any alleviating factors.  Patient denies any weakness/numbness/paresthesias in any extremity.  Patient denies any head trauma or loss of consciousness         Past Medical History:  Diagnosis Date  . Arthritis   . Family history of adverse reaction to anesthesia    MOM-DISORIENTED  . GERD (gastroesophageal reflux disease)     Patient Active Problem List   Diagnosis Date Noted  . Muscle strain of lower extremity 01/11/2020    Past Surgical History:  Procedure Laterality Date  . CARPAL TUNNEL RELEASE Bilateral   . CERVICAL CONE BIOPSY    . CHOLECYSTECTOMY    . KNEE ARTHROSCOPY Left 06/07/2018   Procedure: ARTHROSCOPY KNEE WITH MEDIAL AND LATERAL MENISCAL REPAIR-LEFT;  Surgeon: Deeann Saint, MD;  Location: [facility] ORS;  Service: Orthopedics;  Laterality: Left;  . KNEE ARTHROSCOPY WITH MENISCAL REPAIR Left 08/10/2017   Procedure: KNEE ARTHROSCOPY WITH MENISCAL REPAIR;  Surgeon: Deeann Saint, MD;  Location: [facility] ORS;  Service: Orthopedics;  Laterality: Left;  . TRIGGER FINGER RELEASE  09/29/2019    Prior to Admission medications   Medication Sig Start  Date End Date Taking? Authorizing Provider  esomeprazole (NEXIUM) 20 MG capsule Take 20 mg by mouth daily at 12 noon.    [provider]  gabapentin (NEURONTIN) 300 MG capsule gabapentin 300 mg capsule  TAKE (1) CAPSULE BY MOUTH THREE TIMES DAILY    [provider]  medroxyPROGESTERone (DEPO-PROVERA) 150 MG/ML injection INJECT I.M. EVERY 3 MONTHS. 12/27/19   Vena Austria, MD  meloxicam (MOBIC) 15 MG tablet Take 1 tablet (15 mg total) by mouth daily. 06/07/18   Deeann Saint, MD  metroNIDAZOLE (FLAGYL) 500 MG tablet TAKE 4 TABLETS BY MOUTH ONCE FOR ONE DOSE. Patient not taking: Reported on 05/04/2020 04/24/20   Nadara Mustard, MD  Probiotic CAPS Take 1 capsule by mouth daily at 12 noon.    [provider]    Allergies Fluconazole, Penicillins, and Sulfa antibiotics  No family history on file.  Social History Social History   Tobacco Use  . Smoking status: Never Smoker  . Smokeless tobacco: Never Used  Vaping Use  . Vaping Use: Never used  Substance Use Topics  . Alcohol use: Never  . Drug use: Never    Review of Systems Constitutional: No fever/chills Eyes: No visual changes. ENT: No sore throat. Cardiovascular: Denies chest pain. Respiratory: Denies shortness of breath. Gastrointestinal: No abdominal pain.  No nausea, no vomiting.  No diarrhea. Genitourinary: Negative for dysuria. Musculoskeletal: Positive for worsening left knee and foot pain Skin: Negative for rash. Neurological: Negative for headaches,  weakness/numbness/paresthesias in any extremity Psychiatric: Negative for suicidal ideation/homicidal ideation   ____________________________________________   PHYSICAL EXAM:  VITAL SIGNS: ED Triage Vitals  Enc Vitals Group     BP 05/17/20 1938 125/81     Pulse Rate 05/17/20 1938 97     Resp 05/17/20 1938 18     Temp 05/17/20 1938 99 F (37.2 C)     Temp Source 05/17/20 1938 Oral     SpO2 05/17/20 1938 100 %     Weight  05/17/20 1931 256 lb (116.1 kg)     Height 05/17/20 1931 5\' 8"  (1.727 m)     Head Circumference --      Peak Flow --      Pain Score 05/17/20 1931 6     Pain Loc --      Pain Edu? --      Excl. in GC? --    Constitutional: Alert and oriented. Well appearing and in no acute distress. Eyes: Conjunctivae are normal. PERRL. Head: Atraumatic. Nose: No congestion/rhinnorhea. Mouth/Throat: Mucous membranes are moist. Neck: No stridor Cardiovascular: Grossly normal heart sounds.  Good peripheral circulation. Respiratory: Normal respiratory effort.  No retractions. Gastrointestinal: Soft and nontender. No distention. Musculoskeletal: No obvious deformities.  Tenderness palpation over the dorsum of the left foot as well as at the medial and lateral aspects of the left knee joint with painful range of motion Neurologic:  Normal speech and language. No gross focal neurologic deficits are appreciated. Skin:  Skin is warm and dry. No rash noted. Psychiatric: Mood and affect are normal. Speech and behavior are normal.  ____________________________________________   LABS (all labs ordered are listed, but only abnormal results are displayed)  Labs Reviewed - No data to display ____________________________________________ RADIOLOGY  ED MD interpretation:    Official radiology report(s): No results found.  ____________________________________________   PROCEDURES  Procedure(s) performed (including Critical Care):  Procedures   ____________________________________________   INITIAL IMPRESSION / ASSESSMENT AND PLAN / ED COURSE  As part of my medical decision making, I reviewed the following data within the electronic MEDICAL RECORD NUMBER Nursing notes reviewed and incorporated, Old chart reviewed, Radiograph reviewed and Notes from prior ED visits reviewed and incorporated        40 year old female presents after a mechanical fall from standing complaining of left knee and left foot  pain Given history, exam and workup I have low suspicion for fracture, dislocation, significant ligamentous injury, septic arthritis, gout flare, new autoimmune arthropathy, or gonococcal arthropathy.  Interventions: X-ray of the left foot and knee pending Disposition: Care of this patient will be signed out to the oncoming physician at the end of my shift.  All pertinent patient information conveyed and all questions answered.  All further care and disposition decisions will be made by the oncoming physician.      ____________________________________________   FINAL CLINICAL IMPRESSION(S) / ED DIAGNOSES  Final diagnoses:  None     ED Discharge Orders    None       Note:  This document was prepared using Dragon voice recognition software and may include unintentional dictation errors.   24, MD 05/17/20 2039

## 2020-05-17 NOTE — ED Notes (Signed)
Pt agreeable with d/c plan as discussed by provider- this nurse has verbally reinforced d/c instructions and provided pt with written copy - pt acknowledges verbal understanding and denies any additional questions, concerns, needs  

## 2020-05-17 NOTE — ED Provider Notes (Signed)
Imaging negative for acute fracture. D/c with brief course of analgesia, ortho follow-up for further imaging as indicated.   Shaune Pollack, MD 05/18/20 [phone]
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
## 2020-07-03 ENCOUNTER — Ambulatory Visit (INDEPENDENT_AMBULATORY_CARE_PROVIDER_SITE_OTHER): Payer: No Typology Code available for payment source

## 2020-07-03 ENCOUNTER — Other Ambulatory Visit: Payer: Self-pay

## 2020-07-03 DIAGNOSIS — Z3042 Encounter for surveillance of injectable contraceptive: Secondary | ICD-10-CM

## 2020-07-03 MED ORDER — MEDROXYPROGESTERONE ACETATE 150 MG/ML IM SUSP
150.0000 mg | Freq: Once | INTRAMUSCULAR | Status: AC
  Administered 2020-07-03: 150 mg via INTRAMUSCULAR

## 2020-07-03 NOTE — Progress Notes (Signed)
Patient presents today for Depo Provera injection within dates. Given IM LD. Patient tolerated well.  

## 2020-09-25 ENCOUNTER — Ambulatory Visit: Payer: No Typology Code available for payment source

## 2020-10-01 ENCOUNTER — Other Ambulatory Visit: Payer: Self-pay | Admitting: Obstetrics and Gynecology

## 2020-10-02 ENCOUNTER — Other Ambulatory Visit: Payer: Self-pay

## 2020-10-02 ENCOUNTER — Ambulatory Visit (INDEPENDENT_AMBULATORY_CARE_PROVIDER_SITE_OTHER): Payer: 59

## 2020-10-02 DIAGNOSIS — Z3042 Encounter for surveillance of injectable contraceptive: Secondary | ICD-10-CM

## 2020-10-02 MED ORDER — MEDROXYPROGESTERONE ACETATE 150 MG/ML IM SUSP: INTRAMUSCULAR | 0 refills | Status: DC

## 2020-10-02 MED ORDER — MEDROXYPROGESTERONE ACETATE 150 MG/ML IM SUSP
150.0000 mg | Freq: Once | INTRAMUSCULAR | Status: AC
  Administered 2020-10-02: 10:00:00 150 mg via INTRAMUSCULAR

## 2020-10-02 NOTE — Progress Notes (Signed)
Pt here for depo which was given IM right deltoid.  NDC# 2542-7062-37  Pt states she will need a refill b/c she will be due for inj before she is due for her annual.  Adv will send in refill.

## 2020-12-25 ENCOUNTER — Ambulatory Visit (INDEPENDENT_AMBULATORY_CARE_PROVIDER_SITE_OTHER): Payer: No Typology Code available for payment source

## 2020-12-25 ENCOUNTER — Other Ambulatory Visit: Payer: Self-pay

## 2020-12-25 DIAGNOSIS — Z3042 Encounter for surveillance of injectable contraceptive: Secondary | ICD-10-CM

## 2020-12-25 MED ORDER — MEDROXYPROGESTERONE ACETATE 150 MG/ML IM SUSP
150.0000 mg | Freq: Once | INTRAMUSCULAR | Status: AC
  Administered 2020-12-25: 150 mg via INTRAMUSCULAR

## 2020-12-25 NOTE — Progress Notes (Signed)
Pt here for depo which was given IM left deltoid.  Pt tolerated inj well.  NDC# [id number]

## 2021-01-08 ENCOUNTER — Ambulatory Visit: Payer: 59 | Admitting: Obstetrics & Gynecology

## 2021-02-13 ENCOUNTER — Ambulatory Visit: Payer: No Typology Code available for payment source | Admitting: Obstetrics & Gynecology

## 2021-02-25 ENCOUNTER — Encounter: Payer: Self-pay | Admitting: Obstetrics and Gynecology

## 2021-02-26 ENCOUNTER — Other Ambulatory Visit: Payer: Self-pay | Admitting: Obstetrics and Gynecology

## 2021-02-26 MED ORDER — METRONIDAZOLE 500 MG PO TABS: 500.0000 mg | ORAL_TABLET | Freq: Two times a day (BID) | ORAL | 0 refills | Status: AC

## 2021-02-27 ENCOUNTER — Ambulatory Visit: Payer: No Typology Code available for payment source | Admitting: Obstetrics & Gynecology

## 2021-03-19 ENCOUNTER — Other Ambulatory Visit: Payer: Self-pay

## 2021-03-19 ENCOUNTER — Ambulatory Visit (INDEPENDENT_AMBULATORY_CARE_PROVIDER_SITE_OTHER): Payer: No Typology Code available for payment source

## 2021-03-19 DIAGNOSIS — Z3042 Encounter for surveillance of injectable contraceptive: Secondary | ICD-10-CM | POA: Diagnosis not present

## 2021-03-19 MED ORDER — MEDROXYPROGESTERONE ACETATE 150 MG/ML IM SUSP
150.0000 mg | Freq: Once | INTRAMUSCULAR | Status: AC
  Administered 2021-03-19: 150 mg via INTRAMUSCULAR

## 2021-03-19 NOTE — Progress Notes (Signed)
Pt here for depo which was given IM right deltoid.  Pt tolerated well. NDC# [id number]

## 2021-03-26 ENCOUNTER — Encounter: Payer: Self-pay | Admitting: Obstetrics and Gynecology

## 2021-03-26 ENCOUNTER — Other Ambulatory Visit (HOSPITAL_COMMUNITY)
Admission: RE | Admit: 2021-03-26 | Discharge: 2021-03-26 | Disposition: A | Discharge status: Home / Self Care | Payer: No Typology Code available for payment source | Source: Ambulatory Visit | Attending: Obstetrics and Gynecology | Admitting: Obstetrics and Gynecology

## 2021-03-26 ENCOUNTER — Ambulatory Visit (INDEPENDENT_AMBULATORY_CARE_PROVIDER_SITE_OTHER): Payer: No Typology Code available for payment source | Admitting: Obstetrics and Gynecology

## 2021-03-26 ENCOUNTER — Other Ambulatory Visit: Payer: Self-pay

## 2021-03-26 VITALS — BP 118/76 | HR 75 | Ht 68.0 in | Wt 258.0 lb

## 2021-03-26 DIAGNOSIS — Z1239 Encounter for other screening for malignant neoplasm of breast: Secondary | ICD-10-CM | POA: Diagnosis not present

## 2021-03-26 DIAGNOSIS — K219 Gastro-esophageal reflux disease without esophagitis: Secondary | ICD-10-CM

## 2021-03-26 DIAGNOSIS — Z01419 Encounter for gynecological examination (general) (routine) without abnormal findings: Secondary | ICD-10-CM

## 2021-03-26 DIAGNOSIS — Z3042 Encounter for surveillance of injectable contraceptive: Secondary | ICD-10-CM | POA: Diagnosis not present

## 2021-03-26 DIAGNOSIS — Z124 Encounter for screening for malignant neoplasm of cervix: Secondary | ICD-10-CM

## 2021-03-26 MED ORDER — MEDROXYPROGESTERONE ACETATE 150 MG/ML IM SUSP
INTRAMUSCULAR | 3 refills | Status: DC
Start: 2021-03-26 — End: 2021-11-19

## 2021-03-26 NOTE — Patient Instructions (Signed)
[facility] [address] [place] [address]  [facility] [place]  [address] [place]  [address]  Phone: [phone]  

## 2021-03-26 NOTE — Progress Notes (Signed)
Gynecology Annual Exam  PCP: Pcp, No  Chief Complaint:  Chief Complaint  Patient presents with   Gynecologic Exam    Annual - no concerns. RM 5    History of Present Illness: Patient is a 40 y.o. G1P1001 presents for annual exam. The patient has no complaints today.   LMP: No LMP recorded. Patient has had an injection. Amenorrhea on Depo Provera   The patient is sexually active. She currently uses Depo-Provera injections for contraception. She denies dyspareunia.  The patient does perform self breast exams.  There is no notable family history of breast or ovarian cancer in her family.  The patient wears seatbelts: yes.   The patient has regular exercise: not asked.    The patient denies current symptoms of depression.    GERD symptoms long standing and upper abdominal discomfort, worse after eating. Was started on sucralfate by PCP.  No H. Pylori testing.  Status post prior cholecystectomy  Review of Systems: Review of Systems  Constitutional:  Negative for chills and fever.  HENT:  Negative for congestion.   Respiratory:  Negative for cough and shortness of breath.   Cardiovascular:  Negative for chest pain and palpitations.  Gastrointestinal:  Negative for abdominal pain, constipation, diarrhea, heartburn, nausea and vomiting.  Genitourinary:  Negative for dysuria, frequency and urgency.  Skin:  Negative for itching and rash.  Neurological:  Negative for dizziness and headaches.  Endo/Heme/Allergies:  Negative for polydipsia.  Psychiatric/Behavioral:  Negative for depression.    Past Medical History:  Patient Active Problem List   Diagnosis Date Noted   Muscle strain of lower extremity 01/11/2020    Past Surgical History:  Past Surgical History:  Procedure Laterality Date   CARPAL TUNNEL RELEASE Bilateral    CERVICAL CONE BIOPSY     CHOLECYSTECTOMY     KNEE ARTHROSCOPY Left 06/07/2018   Procedure: ARTHROSCOPY KNEE WITH MEDIAL AND LATERAL MENISCAL REPAIR-LEFT;   Surgeon: Deeann Saint, MD;  Location: [facility];  Service: Orthopedics;  Laterality: Left;   KNEE ARTHROSCOPY WITH MENISCAL REPAIR Left 08/10/2017   Procedure: KNEE ARTHROSCOPY WITH MENISCAL REPAIR;  Surgeon: Deeann Saint, MD;  Location: [facility];  Service: Orthopedics;  Laterality: Left;   TRIGGER FINGER RELEASE  09/29/2019    Gynecologic History:  No LMP recorded. Patient has had an injection. 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 ECC 11/24/2017 NILM HPV 16 positive   Obstetric History: G1P1001  Family History:  Family History  Problem Relation Age of Onset   Hypertension Mother    Diabetes Mother    Hyperlipidemia Mother    Kidney cancer Mother    Hypertension Father    Hyperlipidemia Father    Heart attack Father     Social History:  Social History   Socioeconomic History   Marital status: Married    Spouse name: Not on file   Number of children: Not on file   Years of education: Not on file   Highest education level: Not on file  Occupational History   Not on file  Tobacco Use   Smoking status: Never   Smokeless tobacco: Never  Vaping Use   Vaping Use: Never used  Substance and Sexual Activity   Alcohol use: Never   Drug use: Never   Sexual activity: Yes    Birth control/protection: Injection  Other Topics Concern   Not on file  Social History Narrative   Not on file   Social Determinants of Health   Financial Resource Strain: Not on file  Food Insecurity: Not on file  Transportation Needs: Not on file  Physical Activity: Not on file  Stress: Not on file  Social Connections: Not on file  Intimate Partner Violence: Not on file    Allergies:  Allergies  Allergen Reactions   Fluconazole Itching, Rash and Other (See Comments)    Penicillins Itching and Rash    Has patient had a PCN reaction causing immediate rash, facial/tongue/throat swelling, SOB or lightheadedness with hypotension: Yes Has patient had a PCN reaction causing severe rash involving mucus membranes or skin necrosis: No Has patient had a PCN reaction that required hospitalization: Yes Has patient had a PCN reaction occurring within the last 10 years: Yes If all of the above answers are "NO", then may proceed with Cephalosporin use.    Sulfa Antibiotics Itching and Rash    Medications: Prior to Admission medications   Medication Sig Start Date End Date Taking? Authorizing Provider  esomeprazole (NEXIUM) 20 MG capsule Take 20 mg by mouth daily at 12 noon.    [provider]  gabapentin (NEURONTIN) 300 MG capsule gabapentin 300 mg capsule  TAKE (1) CAPSULE BY MOUTH THREE TIMES DAILY    [provider]  HYDROcodone-acetaminophen (NORCO/VICODIN) 5-325 MG tablet Take 1 tablet by mouth every 6 (six) hours as needed for severe pain. 05/17/20 05/17/21  Shaune Pollack, MD  medroxyPROGESTERone (DEPO-PROVERA) 150 MG/ML injection Inject 67ml into muscle every 12 weeks. 10/02/20   Nadara Mustard, MD  meloxicam (MOBIC) 15 MG tablet Take 1 tablet (15 mg total) by mouth daily. 06/07/18   Deeann Saint, MD  Probiotic CAPS Take 1 capsule by mouth daily at 12 noon.    [provider]    Physical Exam Vitals: Blood pressure 118/76, pulse 75, height 5\' 8"  (1.727 m), weight 258 lb (117 kg).   General: NAD HEENT: normocephalic, anicteric Thyroid: no enlargement, no palpable nodules Pulmonary: No increased work of breathing, CTAB Cardiovascular: RRR, distal pulses 2+ Breast: Breast symmetrical, no tenderness, no palpable nodules or masses, no skin or nipple retraction present, no nipple discharge.  No axillary or supraclavicular lymphadenopathy. Abdomen: NABS, soft, non-tender, non-distended.  Umbilicus without lesions.  No hepatomegaly,  splenomegaly or masses palpable. No evidence of hernia  Genitourinary:  External: Normal external female genitalia.  Normal urethral meatus, normal Bartholin's and Skene's glands.    Vagina: Normal vaginal mucosa, no evidence of prolapse.    Cervix: Grossly normal in appearance, no bleeding  Uterus: Non-enlarged, mobile, normal contour.  No CMT  Adnexa: ovaries non-enlarged, no adnexal masses  Rectal: deferred  Lymphatic: no evidence of inguinal lymphadenopathy Extremities: no edema, erythema, or tenderness Neurologic: Grossly intact Psychiatric: mood appropriate, affect full  Female chaperone present for pelvic and breast  portions of the physical exam    Assessment: 40 y.o. G1P1001 routine annual exam  Plan: Problem List Items Addressed This Visit   None Visit Diagnoses     Encounter for gynecological examination without abnormal finding    -  Primary   Breast screening       Relevant Orders   MM 3D SCREEN BREAST BILATERAL   Screening for malignant neoplasm of cervix       Relevant Orders   Cytology - PAP   Encounter for surveillance of injectable contraceptive  1) Mammogram - recommend yearly screening mammogram.  Mammogram Was ordered today   2) STI screening  was notoffered and therefore not obtained  3) ASCCP guidelines and rational discussed.  Repeat NILM HPV 16 positive pap 2021  4) Contraception - the patient is currently using  Depo-Provera injections.  She is happy with her current form of contraception and plans to continue  5) Colonoscopy -- Screening recommended starting at age 15 for average risk individuals, age 46 for individuals deemed at increased risk (including African Americans) and recommended to continue until age 61.  For patient age 13-85 individualized approach is recommended.  Gold standard screening is via colonoscopy, Cologuard screening is an acceptable alternative for patient unwilling or unable to undergo colonoscopy.  "Colorectal  cancer screening for average?risk adults: 2018 guideline update from the American Cancer Society"CA: A Cancer Journal for Clinicians: Sep 03, 2016   6) Routine healthcare maintenance including cholesterol, diabetes screening discussed managed by PCP  7) Return in about 1 year (around 03/26/2022) for annual.   Vena Austria, MD, [facility], [facility] 03/26/2021, 9:08 AM

## 2021-03-29 LAB — CYTOLOGY - PAP
Adequacy: ABSENT
Comment: NEGATIVE
Comment: NEGATIVE
Diagnosis: UNDETERMINED — AB
HPV 16: NEGATIVE
HPV 18 / 45: NEGATIVE
High risk HPV: POSITIVE — AB

## 2021-04-02 ENCOUNTER — Encounter: Payer: Self-pay | Admitting: Obstetrics and Gynecology

## 2021-04-08 ENCOUNTER — Encounter: Payer: Self-pay | Admitting: Obstetrics and Gynecology

## 2021-04-08 NOTE — Progress Notes (Signed)
Colposcopy sometime in the next 1-4 weeks

## 2021-04-16 ENCOUNTER — Ambulatory Visit: Payer: No Typology Code available for payment source | Admitting: Obstetrics & Gynecology

## 2021-04-18 ENCOUNTER — Other Ambulatory Visit: Payer: Self-pay

## 2021-04-18 ENCOUNTER — Other Ambulatory Visit: Payer: Self-pay | Admitting: Nurse Practitioner

## 2021-04-18 ENCOUNTER — Ambulatory Visit
Admission: RE | Admit: 2021-04-18 | Discharge: 2021-04-18 | Disposition: A | Discharge status: Home / Self Care | Payer: No Typology Code available for payment source | Source: Ambulatory Visit | Attending: Nurse Practitioner | Admitting: Nurse Practitioner

## 2021-04-18 DIAGNOSIS — R140 Abdominal distension (gaseous): Secondary | ICD-10-CM

## 2021-04-18 DIAGNOSIS — K529 Noninfective gastroenteritis and colitis, unspecified: Secondary | ICD-10-CM | POA: Diagnosis present

## 2021-04-18 DIAGNOSIS — R1084 Generalized abdominal pain: Secondary | ICD-10-CM

## 2021-04-18 DIAGNOSIS — R110 Nausea: Secondary | ICD-10-CM

## 2021-04-18 MED ORDER — IOHEXOL 300 MG/ML  SOLN
100.0000 mL | Freq: Once | INTRAMUSCULAR | Status: AC | PRN
  Administered 2021-04-18: 100 mL via INTRAVENOUS

## 2021-04-22 ENCOUNTER — Other Ambulatory Visit: Payer: Self-pay | Admitting: Nurse Practitioner

## 2021-04-22 DIAGNOSIS — R1084 Generalized abdominal pain: Secondary | ICD-10-CM

## 2021-04-22 DIAGNOSIS — K838 Other specified diseases of biliary tract: Secondary | ICD-10-CM

## 2021-04-30 ENCOUNTER — Other Ambulatory Visit: Payer: Self-pay

## 2021-04-30 ENCOUNTER — Ambulatory Visit
Admission: RE | Admit: 2021-04-30 | Discharge: 2021-04-30 | Disposition: A | Discharge status: Home / Self Care | Payer: No Typology Code available for payment source | Source: Ambulatory Visit | Attending: Nurse Practitioner | Admitting: Nurse Practitioner

## 2021-04-30 DIAGNOSIS — K838 Other specified diseases of biliary tract: Secondary | ICD-10-CM | POA: Diagnosis present

## 2021-04-30 DIAGNOSIS — R1084 Generalized abdominal pain: Secondary | ICD-10-CM | POA: Insufficient documentation

## 2021-05-07 ENCOUNTER — Other Ambulatory Visit: Payer: Self-pay | Admitting: Nurse Practitioner

## 2021-05-07 ENCOUNTER — Other Ambulatory Visit (HOSPITAL_COMMUNITY): Payer: Self-pay | Admitting: Nurse Practitioner

## 2021-05-07 DIAGNOSIS — K838 Other specified diseases of biliary tract: Secondary | ICD-10-CM

## 2021-05-18 ENCOUNTER — Other Ambulatory Visit: Payer: Self-pay

## 2021-05-18 ENCOUNTER — Other Ambulatory Visit: Payer: Self-pay | Admitting: Nurse Practitioner

## 2021-05-18 ENCOUNTER — Ambulatory Visit
Admission: RE | Admit: 2021-05-18 | Discharge: 2021-05-18 | Disposition: A | Discharge status: Home / Self Care | Payer: No Typology Code available for payment source | Source: Ambulatory Visit | Attending: Nurse Practitioner | Admitting: Nurse Practitioner

## 2021-05-18 DIAGNOSIS — K838 Other specified diseases of biliary tract: Secondary | ICD-10-CM

## 2021-05-18 MED ORDER — GADOBUTROL 1 MMOL/ML IV SOLN
10.0000 mL | Freq: Once | INTRAVENOUS | Status: AC | PRN
  Administered 2021-05-18: 10 mL via INTRAVENOUS

## 2021-05-21 ENCOUNTER — Ambulatory Visit: Payer: No Typology Code available for payment source | Admitting: Obstetrics & Gynecology

## 2021-06-04 ENCOUNTER — Ambulatory Visit (INDEPENDENT_AMBULATORY_CARE_PROVIDER_SITE_OTHER): Payer: No Typology Code available for payment source

## 2021-06-04 ENCOUNTER — Other Ambulatory Visit: Payer: Self-pay

## 2021-06-04 DIAGNOSIS — Z3042 Encounter for surveillance of injectable contraceptive: Secondary | ICD-10-CM | POA: Diagnosis not present

## 2021-06-04 MED ORDER — MEDROXYPROGESTERONE ACETATE 150 MG/ML IM SUSP
150.0000 mg | Freq: Once | INTRAMUSCULAR | Status: AC
  Administered 2021-06-04: 150 mg via INTRAMUSCULAR

## 2021-06-04 NOTE — Progress Notes (Signed)
Pt here for depo which was given IM right deltoid.  Pt tolerated well.  NDC# 9983-3825-05

## 2021-07-03 ENCOUNTER — Ambulatory Visit: Payer: No Typology Code available for payment source | Admitting: Obstetrics & Gynecology

## 2021-07-09 ENCOUNTER — Ambulatory Visit: Admit: 2021-07-09 | Payer: No Typology Code available for payment source

## 2021-07-09 SURGERY — COLONOSCOPY WITH PROPOFOL
Anesthesia: General

## 2021-07-29 ENCOUNTER — Telehealth: Payer: Self-pay

## 2021-07-29 ENCOUNTER — Other Ambulatory Visit: Payer: Self-pay | Admitting: Advanced Practice Midwife

## 2021-07-29 DIAGNOSIS — B9689 Other specified bacterial agents as the cause of diseases classified elsewhere: Secondary | ICD-10-CM

## 2021-07-29 MED ORDER — METRONIDAZOLE 500 MG PO TABS: 500.0000 mg | ORAL_TABLET | Freq: Two times a day (BID) | ORAL | 0 refills | Status: AC

## 2021-07-29 NOTE — Telephone Encounter (Signed)
Pt left msg triage requesting RF of metronidazole tablets. Dr Bonney Aid advised her to call our office when she needs it. Can you RF? ?

## 2021-07-29 NOTE — Progress Notes (Signed)
Flagyl sent per patient request ?

## 2021-08-20 ENCOUNTER — Ambulatory Visit (INDEPENDENT_AMBULATORY_CARE_PROVIDER_SITE_OTHER): Payer: No Typology Code available for payment source

## 2021-08-20 DIAGNOSIS — Z3042 Encounter for surveillance of injectable contraceptive: Secondary | ICD-10-CM | POA: Diagnosis not present

## 2021-08-20 MED ORDER — MEDROXYPROGESTERONE ACETATE 150 MG/ML IM SUSP
150.0000 mg | Freq: Once | INTRAMUSCULAR | Status: AC
  Administered 2021-08-20: 150 mg via INTRAMUSCULAR

## 2021-08-20 NOTE — Progress Notes (Signed)
Patient presents today for Depo Provera injection within dates. Given IM Left Deltoid. Patient tolerated well. 

## 2021-11-19 ENCOUNTER — Encounter: Payer: Self-pay | Admitting: Obstetrics & Gynecology

## 2021-11-19 ENCOUNTER — Ambulatory Visit (INDEPENDENT_AMBULATORY_CARE_PROVIDER_SITE_OTHER): Payer: No Typology Code available for payment source | Admitting: Obstetrics & Gynecology

## 2021-11-19 ENCOUNTER — Other Ambulatory Visit (HOSPITAL_COMMUNITY)
Admission: RE | Admit: 2021-11-19 | Discharge: 2021-11-19 | Disposition: A | Discharge status: Home / Self Care | Payer: No Typology Code available for payment source | Source: Ambulatory Visit | Attending: Obstetrics & Gynecology | Admitting: Obstetrics & Gynecology

## 2021-11-19 VITALS — BP 140/80 | Ht 68.0 in | Wt 257.0 lb

## 2021-11-19 DIAGNOSIS — Z1231 Encounter for screening mammogram for malignant neoplasm of breast: Secondary | ICD-10-CM

## 2021-11-19 DIAGNOSIS — R87810 Cervical high risk human papillomavirus (HPV) DNA test positive: Secondary | ICD-10-CM | POA: Diagnosis present

## 2021-11-19 DIAGNOSIS — B977 Papillomavirus as the cause of diseases classified elsewhere: Secondary | ICD-10-CM | POA: Insufficient documentation

## 2021-11-19 DIAGNOSIS — R87610 Atypical squamous cells of undetermined significance on cytologic smear of cervix (ASC-US): Secondary | ICD-10-CM | POA: Diagnosis not present

## 2021-11-19 DIAGNOSIS — Z3042 Encounter for surveillance of injectable contraceptive: Secondary | ICD-10-CM

## 2021-11-19 MED ORDER — MEDROXYPROGESTERONE ACETATE 150 MG/ML IM SUSP
150.0000 mg | Freq: Once | INTRAMUSCULAR | Status: AC
  Administered 2021-11-19: 150 mg via INTRAMUSCULAR

## 2021-11-19 MED ORDER — MEDROXYPROGESTERONE ACETATE 150 MG/ML IM SUSP: INTRAMUSCULAR | 3 refills | Status: AC

## 2021-11-19 NOTE — Progress Notes (Signed)
   Subjective:    Patient ID: Allison Mooney, female    DOB: 1980/06/17, 41 y.o.   MRN: [id number]  HPI  41 yo married P42 (43 yo daughter) here with her husband for a colposcopy due to her pap 03/2021 showing ASCUS with + HR HPV. Her papin 2021 was negative for cytological changes but was positive for HPV 16. Her pap in 2019 was exactly the same as 2021. She had a cryosurgery to her cervix about 20 years ago and reports normal paps until 2019.   Review of Systems She is happy with depo provera for contraception and requests a refill.    Objective:   Physical Exam Well nourished, well hydrated White female, no apparent distress She is ambulating and conversing normally. UPT negative, consent signed, time out done Cervix prepped with acetic acid. Transformation zone seen in its entirety. Colpo adequate. 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 I obtained a biopsy at the 5 o'clock position. Silver nitrate was used to achieve hemostasis. ECC obtained. She tolerated the procedure well.      Assessment & Plan:   ASCUS + HR HPV pap- await pathology I will let her know via Mychart the results and plan

## 2021-11-21 ENCOUNTER — Encounter: Payer: Self-pay | Admitting: Obstetrics & Gynecology

## 2021-11-22 ENCOUNTER — Telehealth: Payer: Self-pay

## 2021-11-22 NOTE — Telephone Encounter (Signed)
Spoke with patient. Advised colposcopy results are not back yet. Patient apologized. She received a message that she had new test results and assumed it was her colposcopy results. Advised they may result today or over the weekend. Patient advised Dr. Marice Potter is not in office today, but will return on Monday.

## 2021-11-22 NOTE — Telephone Encounter (Signed)
TRIAGE VOICEMAIL: Patient states her colposcopy results are in. She needs Dr. Marice Potter to call and go over results with her. Patient states she is unable to view results until Dr. Marice Potter has reviewed them. She is best reached before 2 pm. [phone]

## 2021-11-25 LAB — SURGICAL PATHOLOGY

## 2021-11-26 ENCOUNTER — Encounter: Payer: Self-pay | Admitting: Obstetrics & Gynecology

## 2021-12-17 ENCOUNTER — Ambulatory Visit
Admission: RE | Admit: 2021-12-17 | Discharge: 2021-12-17 | Disposition: A | Discharge status: Home / Self Care | Payer: No Typology Code available for payment source | Source: Ambulatory Visit | Attending: Obstetrics & Gynecology | Admitting: Obstetrics & Gynecology

## 2021-12-17 DIAGNOSIS — Z1231 Encounter for screening mammogram for malignant neoplasm of breast: Secondary | ICD-10-CM | POA: Diagnosis present

## 2021-12-19 ENCOUNTER — Other Ambulatory Visit: Payer: Self-pay | Admitting: Obstetrics & Gynecology

## 2021-12-19 DIAGNOSIS — R928 Other abnormal and inconclusive findings on diagnostic imaging of breast: Secondary | ICD-10-CM

## 2021-12-19 DIAGNOSIS — N6489 Other specified disorders of breast: Secondary | ICD-10-CM

## 2022-01-14 ENCOUNTER — Ambulatory Visit
Admission: RE | Admit: 2022-01-14 | Discharge: 2022-01-14 | Disposition: A | Discharge status: Home / Self Care | Payer: No Typology Code available for payment source | Source: Ambulatory Visit | Attending: Obstetrics & Gynecology | Admitting: Obstetrics & Gynecology

## 2022-01-14 DIAGNOSIS — N6489 Other specified disorders of breast: Secondary | ICD-10-CM | POA: Diagnosis present

## 2022-01-14 DIAGNOSIS — R928 Other abnormal and inconclusive findings on diagnostic imaging of breast: Secondary | ICD-10-CM | POA: Diagnosis present

## 2022-02-11 ENCOUNTER — Ambulatory Visit: Payer: No Typology Code available for payment source

## 2022-02-12 ENCOUNTER — Ambulatory Visit: Payer: No Typology Code available for payment source

## 2022-02-18 ENCOUNTER — Ambulatory Visit (INDEPENDENT_AMBULATORY_CARE_PROVIDER_SITE_OTHER): Payer: No Typology Code available for payment source

## 2022-02-18 VITALS — BP 113/76 | Ht 68.0 in | Wt 262.0 lb

## 2022-02-18 DIAGNOSIS — Z3042 Encounter for surveillance of injectable contraceptive: Secondary | ICD-10-CM

## 2022-02-18 MED ORDER — MEDROXYPROGESTERONE ACETATE 150 MG/ML IM SUSP
150.0000 mg | Freq: Once | INTRAMUSCULAR | Status: AC
  Administered 2022-02-18: 150 mg via INTRAMUSCULAR

## 2022-02-18 MED ORDER — MEDROXYPROGESTERONE ACETATE 150 MG/ML IM SUSP: 150.0000 mg | INTRAMUSCULAR | 0 refills | Status: DC

## 2022-02-18 NOTE — Progress Notes (Addendum)
Date last pap: 03/26/2021 Last Depo-Provera: 11/19/2021. Side Effects if any: n/a. Serum HCG indicated?n/a Depo-Provera 150 mg IM given by: Rozell Searing. Jayme Cloud CMA. Next appointment due jan 27-feb 10.

## 2022-02-18 NOTE — Addendum Note (Signed)
Addended by: Loney Laurence on: 02/18/2022 09:08 AM   Modules accepted: Orders

## 2022-02-18 NOTE — Addendum Note (Signed)
Addended by: Loney Laurence on: 02/18/2022 09:10 AM   Modules accepted: Orders

## 2022-02-20 IMAGING — DX DG FOOT COMPLETE 3+V*L*
3 series · 3 of 3 positions shown · non-contrast
Comparison: None.

CLINICAL DATA: Pain since fall today

EXAM:
LEFT FOOT - COMPLETE 3+ VIEW

[foot ap]
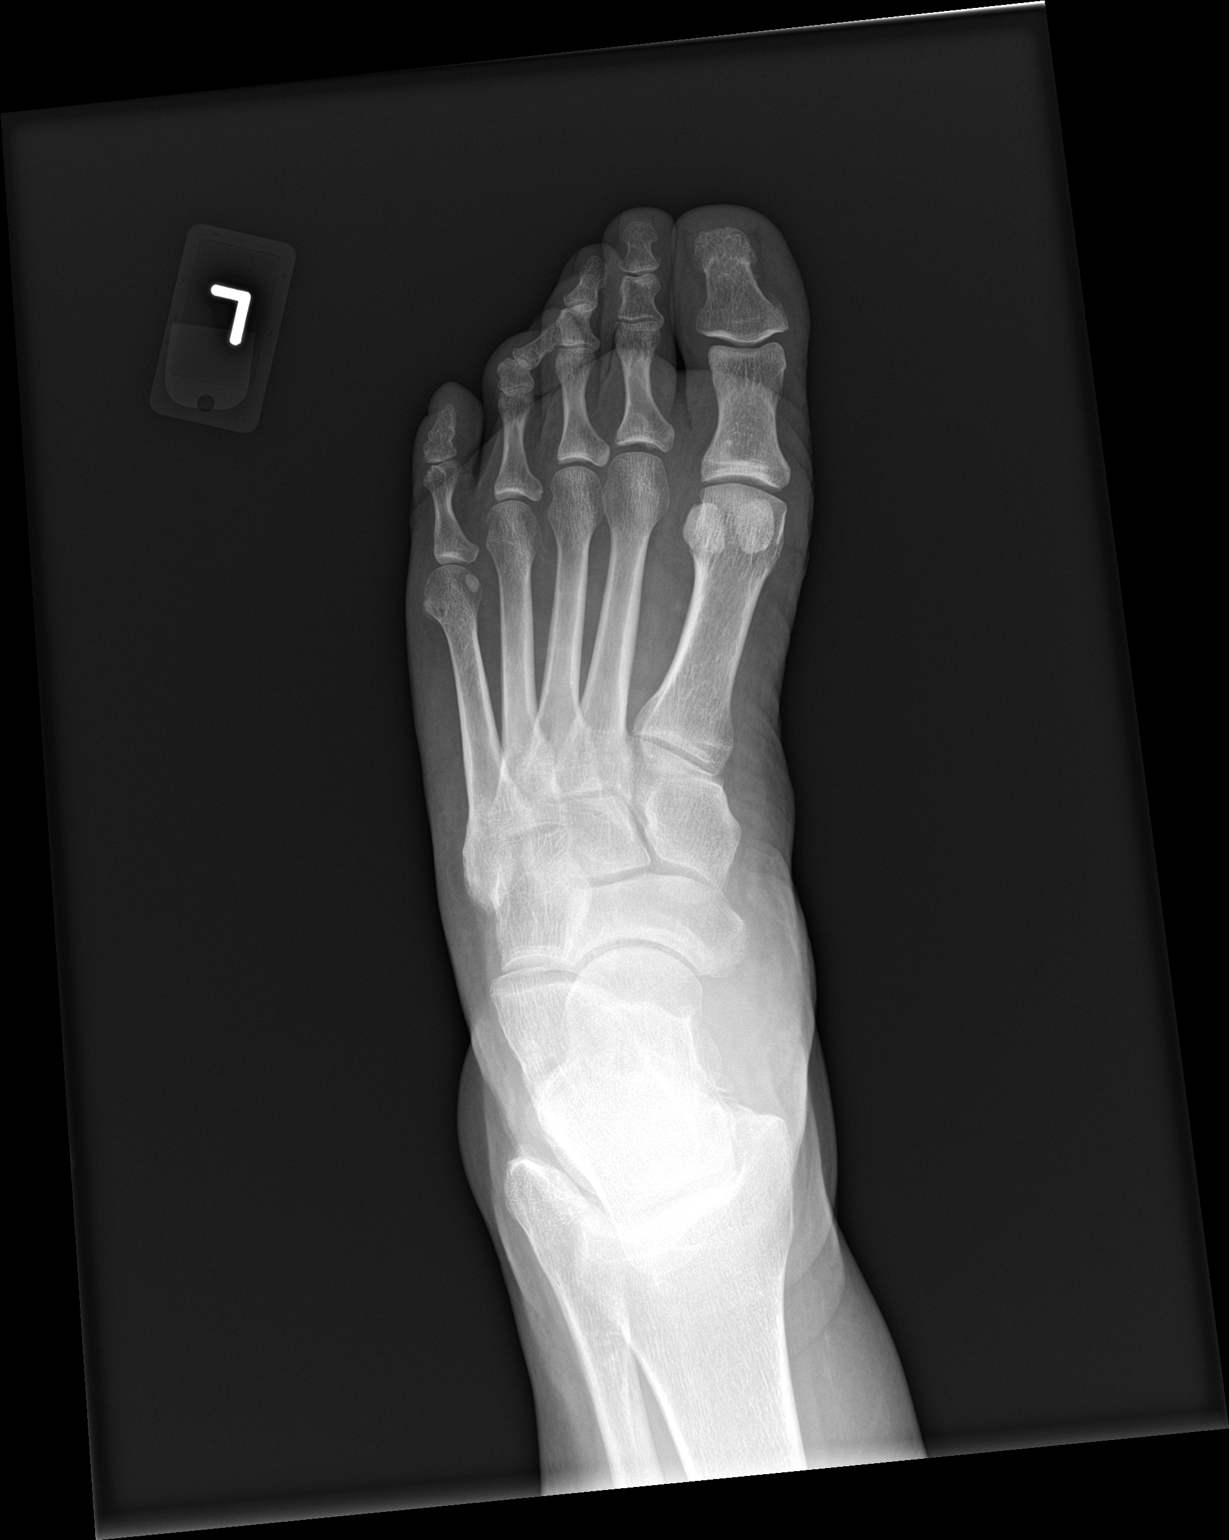

[foot obl]
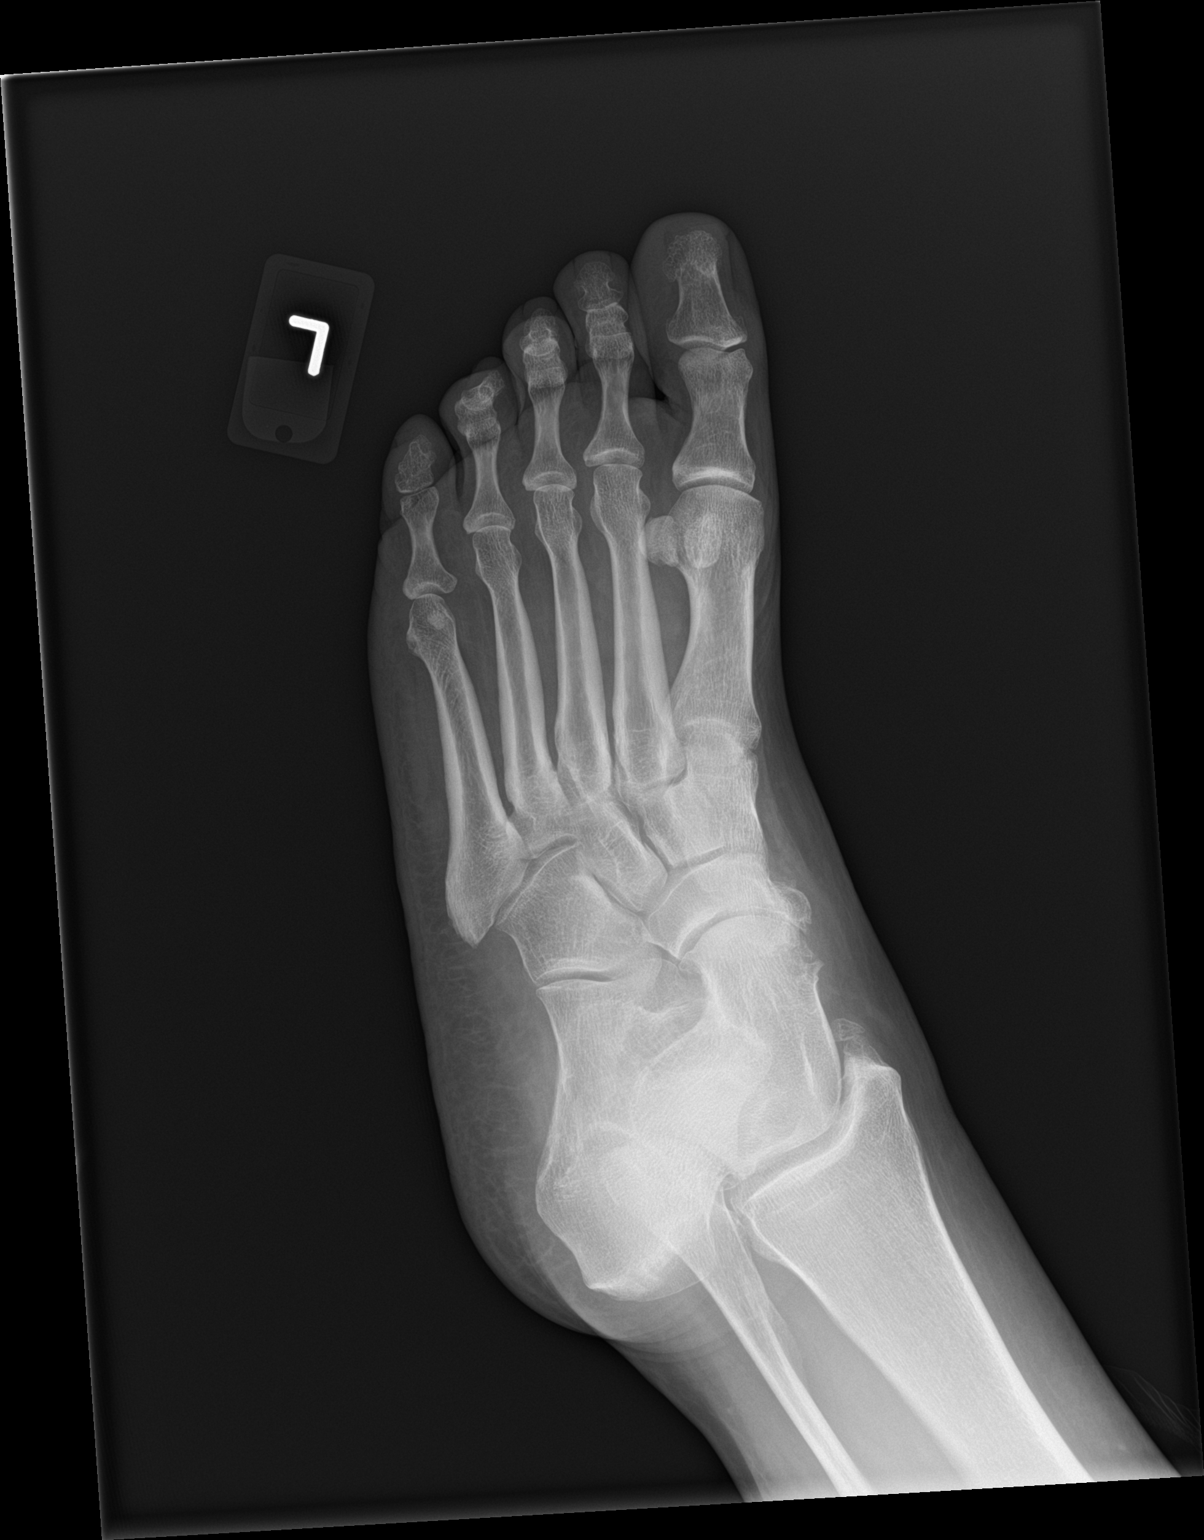

[foot lat]
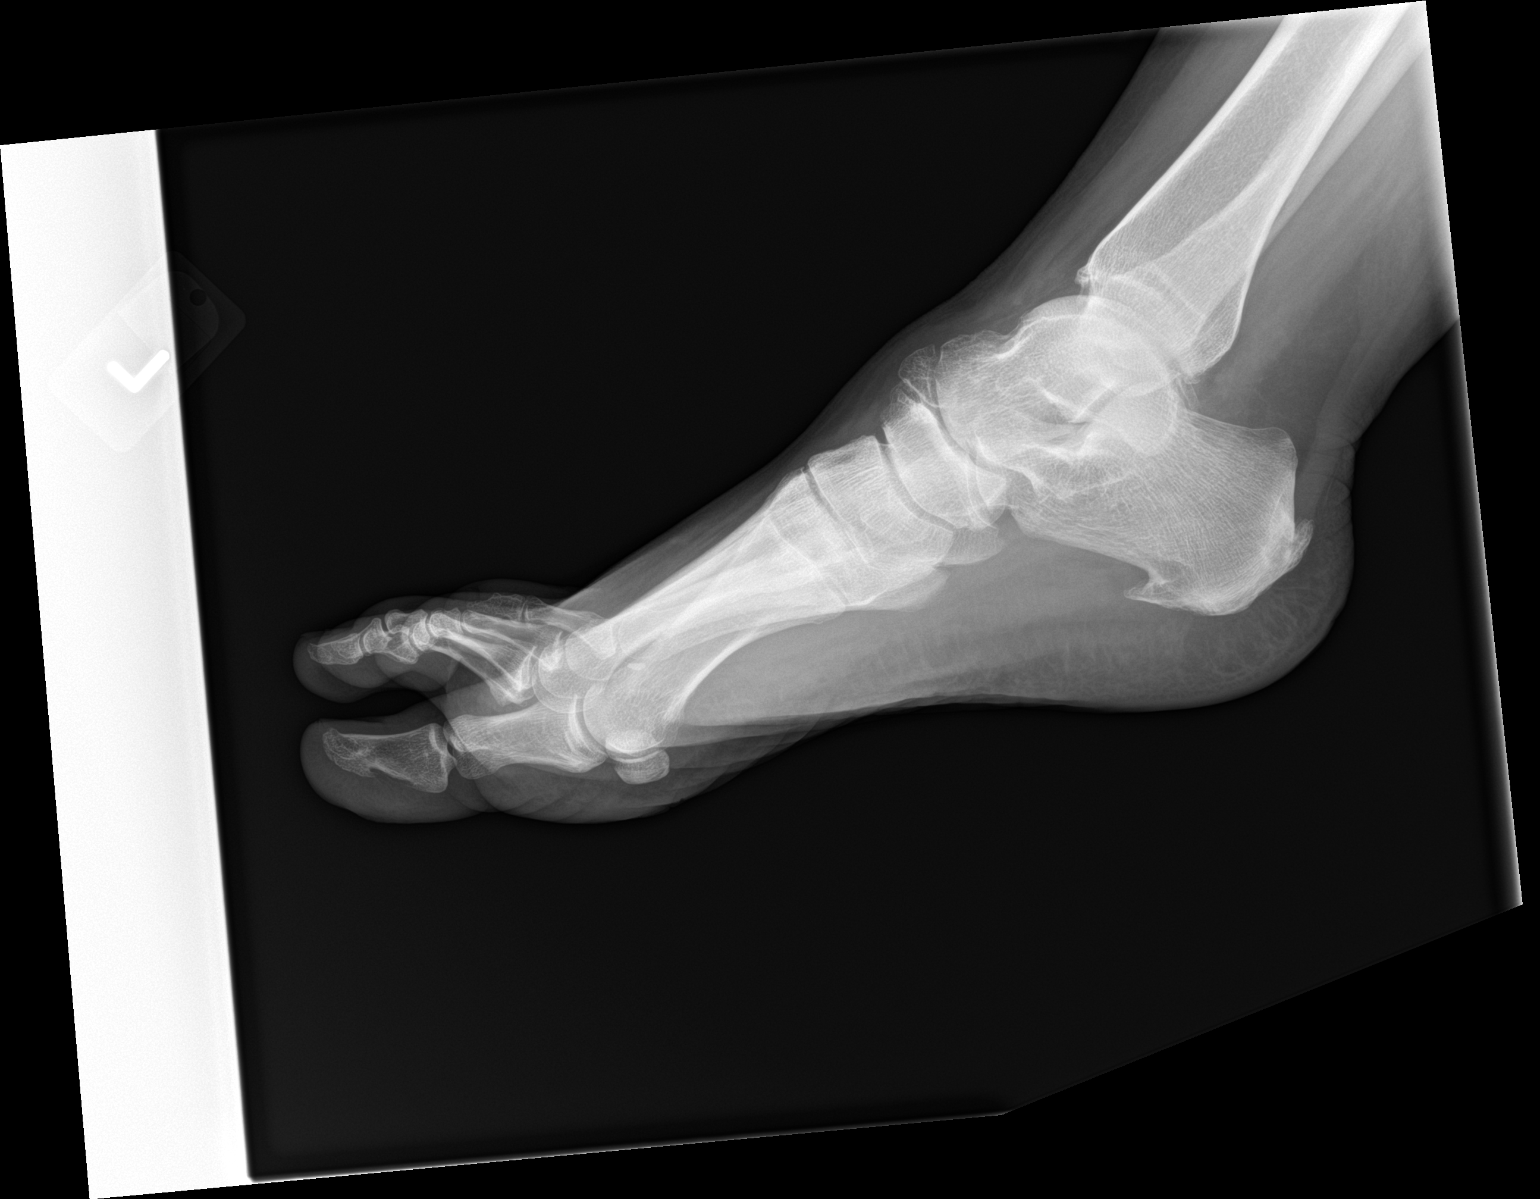

[3 of 3 positions shown; findings below may reference images not displayed]

FINDINGS: There is no evidence of fracture or dislocation. Mild dorsal midfoot
degenerative change. Calcaneal enthesophytes. Soft tissues are
unremarkable.
IMPRESSION: No acute osseous abnormality. Mild dorsal midfoot degenerative
change.

## 2022-05-06 ENCOUNTER — Ambulatory Visit: Payer: No Typology Code available for payment source

## 2022-05-06 VITALS — BP 126/81 | HR 86 | Ht 68.0 in | Wt 263.2 lb

## 2022-05-06 DIAGNOSIS — Z3042 Encounter for surveillance of injectable contraceptive: Secondary | ICD-10-CM | POA: Diagnosis not present

## 2022-05-06 MED ORDER — MEDROXYPROGESTERONE ACETATE 150 MG/ML IM SUSP
150.0000 mg | Freq: Once | INTRAMUSCULAR | Status: AC
  Administered 2022-05-06: 150 mg via INTRAMUSCULAR

## 2022-05-06 NOTE — Progress Notes (Signed)
Last Depo-Provera: 02/18/22. Side Effects if any: NA. Serum HCG indicated? NA. Depo-Provera 150 mg IM given by: Levert Feinstein CMA. Next appointment due APR17-MAY1.

## 2022-05-27 ENCOUNTER — Other Ambulatory Visit (HOSPITAL_COMMUNITY)
Admission: RE | Admit: 2022-05-27 | Discharge: 2022-05-27 | Disposition: A | Discharge status: Home / Self Care | Payer: No Typology Code available for payment source | Source: Ambulatory Visit | Attending: Obstetrics & Gynecology | Admitting: Obstetrics & Gynecology

## 2022-05-27 ENCOUNTER — Encounter: Payer: Self-pay | Admitting: Obstetrics & Gynecology

## 2022-05-27 ENCOUNTER — Ambulatory Visit (INDEPENDENT_AMBULATORY_CARE_PROVIDER_SITE_OTHER): Payer: No Typology Code available for payment source | Admitting: Obstetrics & Gynecology

## 2022-05-27 VITALS — BP 120/80 | Ht 68.0 in | Wt 266.0 lb

## 2022-05-27 DIAGNOSIS — N898 Other specified noninflammatory disorders of vagina: Secondary | ICD-10-CM | POA: Diagnosis not present

## 2022-05-27 DIAGNOSIS — R102 Pelvic and perineal pain: Secondary | ICD-10-CM | POA: Diagnosis not present

## 2022-05-27 MED ORDER — METRONIDAZOLE 500 MG PO TABS: 500.0000 mg | ORAL_TABLET | Freq: Two times a day (BID) | ORAL | 0 refills | Status: DC

## 2022-05-27 NOTE — Progress Notes (Signed)
   Established Patient Office Visit  Subjective   Patient ID: Allison Mooney, female    DOB: [birth year]  Age: 42 y.o. MRN: [id number]  Chief Complaint  Patient presents with   Annual Exam   Vaginitis    Pt states it hurts when she wipes. She's not sure if its yeast or BV. She did have razor burn from shaving.     HPI   Allison Mooney with the issue of "it hurts up inside when I wipe". This started about 2 days ago. This situation happens periodically. She associates it with BV. She says that she takes a probiotic daily and hasn't had this pain for about a year.   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 The ECC was normal. She had + HR HPV 16 in 2021. Objective:     BP 120/80   Ht 5' 8"$  (1.727 m)   Wt 266 lb (120.7 kg)   LMP  (LMP Unknown)   BMI 40.45 kg/m    Physical Exam   Well nourished, well hydrated White female, no apparent distress She is ambulating and conversing normally. EG- shaved There is a small faint ulceration at the anterior mid mucosa at the top of the vaginal introitus. This is the site that she notes hurts with touch/wiping.  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    Allison Filbert, MD

## 2022-05-28 LAB — CERVICOVAGINAL ANCILLARY ONLY
Bacterial Vaginitis (gardnerella): NEGATIVE
Candida Glabrata: NEGATIVE
Candida Vaginitis: POSITIVE — AB
Comment: NEGATIVE
Comment: NEGATIVE
Comment: NEGATIVE
Comment: NEGATIVE
Trichomonas: NEGATIVE

## 2022-05-28 LAB — HSV 1 AND 2 AB, IGG
HSV 1 Glycoprotein G Ab, IgG: 59.8 index — ABNORMAL HIGH (ref 0.00–0.90)
HSV 2 IgG, Type Spec: <0.91 index (ref 0.00–0.90)

## 2022-05-29 ENCOUNTER — Other Ambulatory Visit: Payer: Self-pay

## 2022-05-29 ENCOUNTER — Other Ambulatory Visit: Payer: Self-pay | Admitting: Obstetrics & Gynecology

## 2022-05-29 DIAGNOSIS — B3731 Acute candidiasis of vulva and vagina: Secondary | ICD-10-CM

## 2022-05-29 DIAGNOSIS — B379 Candidiasis, unspecified: Secondary | ICD-10-CM

## 2022-05-29 DIAGNOSIS — B009 Herpesviral infection, unspecified: Secondary | ICD-10-CM

## 2022-05-29 MED ORDER — FLUCONAZOLE 150 MG PO TABS: 150.0000 mg | ORAL_TABLET | Freq: Once | ORAL | 0 refills | Status: AC

## 2022-05-29 MED ORDER — VALACYCLOVIR HCL 500 MG PO TABS: 500.0000 mg | ORAL_TABLET | Freq: Every day | ORAL | 11 refills | Status: AC

## 2022-05-29 NOTE — Progress Notes (Deleted)
Diflucan prescribed for yeast seen on Aptima

## 2022-07-25 DIAGNOSIS — Z3042 Encounter for surveillance of injectable contraceptive: Secondary | ICD-10-CM | POA: Insufficient documentation

## 2022-07-25 NOTE — Progress Notes (Unsigned)
    NURSE VISIT NOTE  Subjective:    Patient ID: Allison Mooney, female    DOB: 10/18/1980, 42 y.o.   MRN: [id number]  HPI  Patient is a 42 y.o. G36P1001 female who presents for depo provera injection.   Objective:    There were no vitals taken for this visit.  Last Annual: 03/26/2021. Last pap: 03/26/21.Colpo: 11/19/2021 Last Depo-Provera: 05/06/22. Side Effects if any: none. Serum HCG indicated? No . Depo-Provera 150 mg IM given by: Rocco Serene, LPN. Site: Right Deltoid  Lab Review   VISIT ONLY@  Assessment:   1. Encounter for Depo-Provera contraception      Plan:   Next appointment due between *** and ***.    Rocco Serene, LPN

## 2022-07-25 NOTE — Patient Instructions (Signed)

## 2022-07-29 ENCOUNTER — Ambulatory Visit: Payer: No Typology Code available for payment source

## 2022-07-29 VITALS — BP 126/84 | HR 94 | Ht 68.0 in | Wt 272.5 lb

## 2022-07-29 DIAGNOSIS — Z3042 Encounter for surveillance of injectable contraceptive: Secondary | ICD-10-CM | POA: Diagnosis not present

## 2022-07-29 MED ORDER — MEDROXYPROGESTERONE ACETATE 150 MG/ML IM SUSP
150.0000 mg | Freq: Once | INTRAMUSCULAR | Status: AC
  Administered 2022-07-29: 150 mg via INTRAMUSCULAR

## 2022-10-01 ENCOUNTER — Telehealth: Payer: Self-pay

## 2022-10-01 MED ORDER — FLUCONAZOLE 150 MG PO TABS: 150.0000 mg | ORAL_TABLET | Freq: Once | ORAL | 0 refills | Status: AC

## 2022-10-01 MED ORDER — METRONIDAZOLE 500 MG PO TABS: 500.0000 mg | ORAL_TABLET | Freq: Two times a day (BID) | ORAL | 0 refills | Status: AC

## 2022-10-01 NOTE — Telephone Encounter (Signed)
Patient contacted office triage line stating that she has a recurrent history of BV and has been experiencing vaginal symptoms, in message patient stated that she was instructed by Dr. Bonney Aid to contact office if she had symptoms of BV and we would call in a oral antibiotic for Flagyl.   Contacted patient back to get clarification of symptoms. Patient reports that she has had vaginal symptoms for the past 3-4 days. Patient states that she has been experiencing external/internal vaginal itching, vaginal burning and discomfort when wiping. Patient describes discharge as thin and is clear/white in color. I advised patient that she would need nurse visit for evaluation and testing to confirm if this is BV or possible yeast. Patient became upset on phone stating that Dr. Bonney Aid told her that she did not have to come in office for this matter and that she could get a prescription if she called in. Patient used [employer]. Please review chart and advise. KW

## 2022-10-01 NOTE — Telephone Encounter (Signed)
Patient advised and prescriptions were sent in for Flagyl and Diflucan. Allergy contradiction to diflucan stated rash/itching. I see in chart Dr. Marice Potter last prescribed this on 05/29/22, when I asked patient did she tolerate medication or experience side effects she stated no. KW

## 2022-10-21 ENCOUNTER — Ambulatory Visit: Payer: No Typology Code available for payment source

## 2022-12-16 ENCOUNTER — Other Ambulatory Visit: Payer: Self-pay | Admitting: Family Medicine

## 2022-12-16 DIAGNOSIS — Z1231 Encounter for screening mammogram for malignant neoplasm of breast: Secondary | ICD-10-CM

## 2022-12-31 ENCOUNTER — Ambulatory Visit
Admission: RE | Admit: 2022-12-31 | Discharge: 2022-12-31 | Disposition: A | Discharge status: Home / Self Care | Payer: 59 | Source: Ambulatory Visit | Attending: Family Medicine | Admitting: Family Medicine

## 2022-12-31 DIAGNOSIS — Z1231 Encounter for screening mammogram for malignant neoplasm of breast: Secondary | ICD-10-CM | POA: Insufficient documentation

## 2023-12-18 ENCOUNTER — Other Ambulatory Visit: Payer: Self-pay | Admitting: Family Medicine

## 2023-12-18 DIAGNOSIS — Z1231 Encounter for screening mammogram for malignant neoplasm of breast: Secondary | ICD-10-CM

## 2024-01-12 ENCOUNTER — Encounter

## 2024-02-02 ENCOUNTER — Ambulatory Visit
Admission: RE | Admit: 2024-02-02 | Discharge: 2024-02-02 | Disposition: A | Discharge status: Home / Self Care | Payer: Self-pay | Source: Ambulatory Visit | Attending: Family Medicine | Admitting: Family Medicine

## 2024-02-02 DIAGNOSIS — Z1231 Encounter for screening mammogram for malignant neoplasm of breast: Secondary | ICD-10-CM | POA: Insufficient documentation
